# Patient Record
Sex: Male | Born: 1940 | Race: White | Hispanic: No | Marital: Single | Smoking: Former smoker
Health system: Southern US, Community
[De-identification: ages and names within clinical notes are randomized; demographics above are authoritative.]

## PROBLEM LIST (undated history)

## (undated) DIAGNOSIS — K219 Gastro-esophageal reflux disease without esophagitis: Secondary | ICD-10-CM

## (undated) DIAGNOSIS — F319 Bipolar disorder, unspecified: Secondary | ICD-10-CM

## (undated) DIAGNOSIS — F0280 Dementia in other diseases classified elsewhere without behavioral disturbance: Secondary | ICD-10-CM

## (undated) DIAGNOSIS — G309 Alzheimer's disease, unspecified: Secondary | ICD-10-CM

## (undated) DIAGNOSIS — R569 Unspecified convulsions: Secondary | ICD-10-CM

## (undated) DIAGNOSIS — I4891 Unspecified atrial fibrillation: Secondary | ICD-10-CM

## (undated) DIAGNOSIS — I509 Heart failure, unspecified: Secondary | ICD-10-CM

## (undated) DIAGNOSIS — I10 Essential (primary) hypertension: Secondary | ICD-10-CM

## (undated) DIAGNOSIS — F489 Nonpsychotic mental disorder, unspecified: Secondary | ICD-10-CM

## (undated) DIAGNOSIS — I639 Cerebral infarction, unspecified: Secondary | ICD-10-CM

---

## 2014-08-31 ENCOUNTER — Emergency Department (HOSPITAL_COMMUNITY): Payer: Medicare Other

## 2014-08-31 ENCOUNTER — Emergency Department (HOSPITAL_COMMUNITY)
Admission: EM | Admit: 2014-08-31 | Discharge: 2014-08-31 | Disposition: A | Discharge status: Home / Self Care | Payer: Medicare Other | Attending: Emergency Medicine | Admitting: Emergency Medicine

## 2014-08-31 ENCOUNTER — Encounter (HOSPITAL_COMMUNITY): Payer: Self-pay

## 2014-08-31 DIAGNOSIS — G40909 Epilepsy, unspecified, not intractable, without status epilepticus: Secondary | ICD-10-CM | POA: Insufficient documentation

## 2014-08-31 DIAGNOSIS — I509 Heart failure, unspecified: Secondary | ICD-10-CM | POA: Insufficient documentation

## 2014-08-31 DIAGNOSIS — Z79899 Other long term (current) drug therapy: Secondary | ICD-10-CM | POA: Diagnosis not present

## 2014-08-31 DIAGNOSIS — Z7982 Long term (current) use of aspirin: Secondary | ICD-10-CM | POA: Insufficient documentation

## 2014-08-31 DIAGNOSIS — Z8719 Personal history of other diseases of the digestive system: Secondary | ICD-10-CM | POA: Diagnosis not present

## 2014-08-31 DIAGNOSIS — Z7902 Long term (current) use of antithrombotics/antiplatelets: Secondary | ICD-10-CM | POA: Insufficient documentation

## 2014-08-31 DIAGNOSIS — Z8673 Personal history of transient ischemic attack (TIA), and cerebral infarction without residual deficits: Secondary | ICD-10-CM | POA: Insufficient documentation

## 2014-08-31 DIAGNOSIS — R451 Restlessness and agitation: Secondary | ICD-10-CM | POA: Diagnosis present

## 2014-08-31 DIAGNOSIS — I10 Essential (primary) hypertension: Secondary | ICD-10-CM | POA: Insufficient documentation

## 2014-08-31 HISTORY — DX: Nonpsychotic mental disorder, unspecified: F48.9

## 2014-08-31 HISTORY — DX: Unspecified atrial fibrillation: I48.91

## 2014-08-31 HISTORY — DX: Gastro-esophageal reflux disease without esophagitis: K21.9

## 2014-08-31 HISTORY — DX: Unspecified convulsions: R56.9

## 2014-08-31 HISTORY — DX: Essential (primary) hypertension: I10

## 2014-08-31 HISTORY — DX: Heart failure, unspecified: I50.9

## 2014-08-31 HISTORY — DX: Cerebral infarction, unspecified: I63.9

## 2014-08-31 HISTORY — DX: Bipolar disorder, unspecified: F31.9

## 2014-08-31 LAB — URINALYSIS, ROUTINE W REFLEX MICROSCOPIC
BILIRUBIN URINE: NEGATIVE
GLUCOSE, UA: NEGATIVE mg/dL
Hgb urine dipstick: NEGATIVE
Ketones, ur: NEGATIVE mg/dL
Leukocytes, UA: NEGATIVE
NITRITE: NEGATIVE
PH: 6.5 (ref 5.0–8.0)
Protein, ur: NEGATIVE mg/dL
SPECIFIC GRAVITY, URINE: 1.021 (ref 1.005–1.030)
Urobilinogen, UA: 1 mg/dL (ref 0.0–1.0)

## 2014-08-31 LAB — CBC WITH DIFFERENTIAL/PLATELET
Basophils Absolute: 0 10*3/uL (ref 0.0–0.1)
Basophils Relative: 0 % (ref 0–1)
EOS ABS: 0.1 10*3/uL (ref 0.0–0.7)
Eosinophils Relative: 2 % (ref 0–5)
HCT: 39.7 % (ref 39.0–52.0)
HEMOGLOBIN: 12.7 g/dL — AB (ref 13.0–17.0)
LYMPHS ABS: 1.1 10*3/uL (ref 0.7–4.0)
Lymphocytes Relative: 18 % (ref 12–46)
MCH: 29.3 pg (ref 26.0–34.0)
MCHC: 32 g/dL (ref 30.0–36.0)
MCV: 91.7 fL (ref 78.0–100.0)
MONO ABS: 0.6 10*3/uL (ref 0.1–1.0)
Monocytes Relative: 10 % (ref 3–12)
NEUTROS PCT: 70 % (ref 43–77)
Neutro Abs: 4.3 10*3/uL (ref 1.7–7.7)
Platelets: 207 10*3/uL (ref 150–400)
RBC: 4.33 MIL/uL (ref 4.22–5.81)
RDW: 13.4 % (ref 11.5–15.5)
WBC: 6 10*3/uL (ref 4.0–10.5)

## 2014-08-31 LAB — COMPREHENSIVE METABOLIC PANEL
ALT: 14 U/L (ref 0–53)
AST: 19 U/L (ref 0–37)
Albumin: 4.8 g/dL (ref 3.5–5.2)
Alkaline Phosphatase: 103 U/L (ref 39–117)
Anion gap: 9 (ref 5–15)
BUN: 24 mg/dL — ABNORMAL HIGH (ref 6–23)
CHLORIDE: 99 mmol/L (ref 96–112)
CO2: 26 mmol/L (ref 19–32)
Calcium: 9.5 mg/dL (ref 8.4–10.5)
Creatinine, Ser: 0.62 mg/dL (ref 0.50–1.35)
GFR calc Af Amer: >90 mL/min (ref >90)
GLUCOSE: 94 mg/dL (ref 70–99)
POTASSIUM: 3.9 mmol/L (ref 3.5–5.1)
SODIUM: 134 mmol/L — AB (ref 135–145)
TOTAL PROTEIN: 7.8 g/dL (ref 6.0–8.3)
Total Bilirubin: 0.3 mg/dL (ref 0.3–1.2)

## 2014-08-31 LAB — PHENYTOIN LEVEL, TOTAL

## 2014-08-31 LAB — VALPROIC ACID LEVEL: Valproic Acid Lvl: <10 ug/mL — ABNORMAL LOW (ref 50.0–100.0)

## 2014-08-31 NOTE — Progress Notes (Signed)
CSW met with pt at bedside upon his request. There was no family present. Per note, pt's chief complaint for presenting to [facility] is due to agitation. Pt was extremely difficult to understand. Pt speaks at a very face pace and appears to have word salad.   Pt states that he is hungry. CSW consulted who states that pt cannot have food at this time. CSW informed pt.  Per note, pt is from [facility] and staff states that there has been a change in behavior.  Willette Brace [phone] ED CSW 08/31/2014 5:42 PM

## 2014-08-31 NOTE — ED Provider Notes (Signed)
CSN: [id number]     Arrival date & time 08/31/14  1116 History   First MD Initiated Contact with Patient 08/31/14 1228     Chief Complaint  Patient presents with  . Agitation      HPI Pt was seen at 1240. Per EMS, [facility] report and [facility] states pt "got mad" and "kicked some furniture," so they called EMS.  EMS states pt was calm and cooperative on their arrival to scene and throughout his transport to the ED. Pt states "the staff was bothering me and I didn't want to be bothered." Pt states he "feels fine." Denies any complaints.    Past Medical History  Diagnosis Date  . Seizures   . Stroke   . CHF (congestive heart failure)   . Hypertension   . Atrial fibrillation   . GERD (gastroesophageal reflux disease)   . Bipolar 1 disorder   . Mental health problem     hoarder     History reviewed. No pertinent past surgical history.   Family History  Problem Relation Age of Onset  . Family history unknown: Yes   History  Substance Use Topics  . Smoking status: Former Games developermoker  . Smokeless tobacco: Never Used  . Alcohol Use: No    Review of Systems ROS: Statement: All systems negative except as marked or noted in the HPI; Constitutional: Negative for fever and chills. ; ; Eyes: Negative for eye pain, redness and discharge. ; ; ENMT: Negative for ear pain, hoarseness, nasal congestion, sinus pressure and sore throat. ; ; Cardiovascular: Negative for chest pain, palpitations, diaphoresis, dyspnea and peripheral edema. ; ; Respiratory: Negative for cough, wheezing and stridor. ; ; Gastrointestinal: Negative for nausea, vomiting, diarrhea, abdominal pain, blood in stool, hematemesis, jaundice and rectal bleeding. . ; ; Genitourinary: Negative for dysuria, flank pain and hematuria. ; ; Musculoskeletal: Negative for back pain and neck pain. Negative for swelling and trauma.; ; Skin: Negative for pruritus, rash, abrasions, blisters, bruising and skin lesion.; ; Neuro: Negative  for headache, lightheadedness and neck stiffness. Negative for weakness, altered level of consciousness , altered mental status, extremity weakness, paresthesias, involuntary movement, seizure and syncope.; Psych:  +agitated.   Allergies  Review of patient's allergies indicates not on file.  Home Medications   Prior to Admission medications   Medication Sig Start Date End Date Taking? Authorizing Provider  acetaminophen (TYLENOL) 500 MG tablet Take 500 mg by mouth every 4 (four) hours as needed for moderate pain.   Yes Historical Provider, MD  albuterol (PROVENTIL HFA;VENTOLIN HFA) 108 (90 BASE) MCG/ACT inhaler Inhale 2 puffs into the lungs every 6 (six) hours as needed for wheezing or shortness of breath.   Yes Historical Provider, MD  alum & mag hydroxide-simeth (MAALOX/MYLANTA) 200-200-20 MG/5ML suspension Take 30 mLs by mouth every 6 (six) hours as needed for indigestion or heartburn.   Yes Historical Provider, MD  aspirin 81 MG tablet Take 81 mg by mouth daily.   Yes Historical Provider, MD  atorvastatin (LIPITOR) 80 MG tablet Take 80 mg by mouth at bedtime.   Yes Historical Provider, MD  clopidogrel (PLAVIX) 75 MG tablet Take 75 mg by mouth daily.   Yes Historical Provider, MD  diltiazem (CARDIZEM) 30 MG tablet Take 30 mg by mouth 2 (two) times daily.   Yes Historical Provider, MD  divalproex (DEPAKOTE SPRINKLE) 125 MG capsule Take 125 mg by mouth 3 (three) times daily.   Yes Historical Provider, MD  enalapril (VASOTEC)  10 MG tablet Take 10 mg by mouth daily.   Yes Historical Provider, MD  ergocalciferol (VITAMIN D2) 50000 UNITS capsule Take 50,000 Units by mouth once a week. Fridays   Yes Historical Provider, MD  furosemide (LASIX) 20 MG tablet Take 20 mg by mouth.   Yes Historical Provider, MD  guaifenesin (ROBITUSSIN) 100 MG/5ML syrup Take 200 mg by mouth 3 (three) times daily as needed for cough.   Yes Historical Provider, MD  levETIRAcetam (KEPPRA) 500 MG tablet Take 1,000 mg by  mouth 2 (two) times daily.   Yes Historical Provider, MD  loperamide (IMODIUM) 2 MG capsule Take 2 mg by mouth as needed for diarrhea or loose stools.   Yes Historical Provider, MD  magnesium hydroxide (MILK OF MAGNESIA) 400 MG/5ML suspension Take 30 mLs by mouth daily as needed for mild constipation or moderate constipation.   Yes Historical Provider, MD  Melatonin 3 MG TABS Take 1 tablet by mouth at bedtime.   Yes Historical Provider, MD  metoprolol (LOPRESSOR) 50 MG tablet Take 50 mg by mouth daily.   Yes Historical Provider, MD  ondansetron (ZOFRAN) 4 MG tablet Take 4 mg by mouth every 6 (six) hours as needed for nausea or vomiting.   Yes Historical Provider, MD  phenytoin (DILANTIN) 100 MG ER capsule Take 200 mg by mouth 3 (three) times daily.   Yes Historical Provider, MD  ranitidine (ZANTAC) 150 MG tablet Take 150 mg by mouth 2 (two) times daily.   Yes Historical Provider, MD  risperiDONE (RISPERDAL) 1 MG/ML oral solution Take 1 mg by mouth 2 (two) times daily.   Yes Historical Provider, MD   BP 128/68 mmHg  Pulse 72  Temp(Src) 97.5 F (36.4 C) (Oral)  Resp 20  SpO2 94% Physical Exam  1245: Physical examination:  Nursing notes reviewed; Vital signs and O2 SAT reviewed;  Constitutional: Well developed, Well nourished, Well hydrated, In no acute distress; Head:  Normocephalic, atraumatic; Eyes: EOMI, PERRL, No scleral icterus; ENMT: Mouth and pharynx normal, Mucous membranes moist; Neck: Supple, Full range of motion, No lymphadenopathy; Cardiovascular: Regular rate and rhythm, No gallop; Respiratory: Breath sounds clear & equal bilaterally, No wheezes.  Speaking full sentences with ease, Normal respiratory effort/excursion; Chest: Nontender, Movement normal; Abdomen: Soft, Nontender, Nondistended, Normal bowel sounds; Genitourinary: No CVA tenderness; Extremities: Pulses normal, No tenderness, No edema, No calf edema or asymmetry.; Neuro: AA&Ox3, Major CN grossly intact.  Speech clear. Moves  all extremities spontaneously and to command without apparent gross focal deficits.; Skin: Color normal, Warm, Dry.; Psych:  Calm, cooperative.   ED Course  Procedures     EKG Interpretation None      MDM  MDM Reviewed: nursing note and vitals Interpretation: labs, x-ray and CT scan     Results for orders placed or performed during the hospital encounter of 08/31/14  Comprehensive metabolic panel  Result Value Ref Range   Sodium 134 (L) 135 - 145 mmol/L   Potassium 3.9 3.5 - 5.1 mmol/L   Chloride 99 96 - 112 mmol/L   CO2 26 19 - 32 mmol/L   Glucose, Bld 94 70 - 99 mg/dL   BUN 24 (H) 6 - 23 mg/dL   Creatinine, Ser 1.61 0.50 - 1.35 mg/dL   Calcium 9.5 8.4 - 09.6 mg/dL   Total Protein 7.8 6.0 - 8.3 g/dL   Albumin 4.8 3.5 - 5.2 g/dL   AST 19 0 - 37 U/L   ALT 14 0 - 53 U/L  Alkaline Phosphatase 103 39 - 117 U/L   Total Bilirubin 0.3 0.3 - 1.2 mg/dL   GFR calc non Af Amer >90 >90 mL/min   GFR calc Af Amer >90 >90 mL/min   Anion gap 9 5 - 15  CBC with Differential  Result Value Ref Range   WBC 6.0 4.0 - 10.5 K/uL   RBC 4.33 4.22 - 5.81 MIL/uL   Hemoglobin 12.7 (L) 13.0 - 17.0 g/dL   HCT 16.1 09.6 - 04.5 %   MCV 91.7 78.0 - 100.0 fL   MCH 29.3 26.0 - 34.0 pg   MCHC 32.0 30.0 - 36.0 g/dL   RDW 40.9 81.1 - 91.4 %   Platelets 207 150 - 400 K/uL   Neutrophils Relative % 70 43 - 77 %   Neutro Abs 4.3 1.7 - 7.7 K/uL   Lymphocytes Relative 18 12 - 46 %   Lymphs Abs 1.1 0.7 - 4.0 K/uL   Monocytes Relative 10 3 - 12 %   Monocytes Absolute 0.6 0.1 - 1.0 K/uL   Eosinophils Relative 2 0 - 5 %   Eosinophils Absolute 0.1 0.0 - 0.7 K/uL   Basophils Relative 0 0 - 1 %   Basophils Absolute 0.0 0.0 - 0.1 K/uL  Urinalysis, Routine w reflex microscopic  Result Value Ref Range   Color, Urine YELLOW YELLOW   APPearance CLEAR CLEAR   Specific Gravity, Urine 1.021 1.005 - 1.030   pH 6.5 5.0 - 8.0   Glucose, UA NEGATIVE NEGATIVE mg/dL   Hgb urine dipstick NEGATIVE NEGATIVE    Bilirubin Urine NEGATIVE NEGATIVE   Ketones, ur NEGATIVE NEGATIVE mg/dL   Protein, ur NEGATIVE NEGATIVE mg/dL   Urobilinogen, UA 1.0 0.0 - 1.0 mg/dL   Nitrite NEGATIVE NEGATIVE   Leukocytes, UA NEGATIVE NEGATIVE   Dg Chest 2 View 08/31/2014   CLINICAL DATA:  Altered mental status  EXAM: CHEST  2 VIEW  COMPARISON:  None.  FINDINGS: Heart size upper normal. Negative for heart failure. Negative for pneumonia or effusion. Lungs are clear.  IMPRESSION: No active cardiopulmonary disease.   Electronically Signed   By: Marlan Palau M.D.   On: 08/31/2014 13:51   Ct Head Wo Contrast 08/31/2014   CLINICAL DATA:  Altered mental status  EXAM: CT HEAD WITHOUT CONTRAST  TECHNIQUE: Contiguous axial images were obtained from the base of the skull through the vertex without intravenous contrast.  COMPARISON:  None.  FINDINGS: There is mild diffuse atrophy. There is no intracranial mass, hemorrhage, extra-axial fluid collection, or midline shift. There is a focal infarct in the superior posterior left temporal lobe which may be recent. There is a prior infarct involving portions of the head of the caudate nucleus on the left, the anterior aspect of the left internal capsule, and the medial most aspect of the left lentiform nucleus. There is also a prior infarct involving the head of the caudate nucleus on the right. There is mild periventricular small vessel disease in the centra semiovale bilaterally.  The bony calvarium appears intact. The mastoid air cells are clear. There is an air-fluid level in the right maxillary antrum with mucoperiosteal thickening in the right maxillary antrum. There is opacification of multiple ethmoid air cells bilaterally. There is leftward deviation of the nasal septum.  IMPRESSION: Age uncertain and possibly recent infarct in the posterior superior left temporal lobe. Prior small infarct in the left basal ganglia. There is mild atrophy with slight periventricular small vessel disease.  No  hemorrhage  or mass effect.  Areas of paranasal sinus disease. Note air-fluid level in the right maxillary antrum.   Electronically Signed   By: Bretta Bang III M.D.   On: 08/31/2014 13:55    1600:  CT with possible recent CVA; MRI brain pending. Depakote and dilantin levels also pending. Sign out to Dr. Lynelle Doctor.   Samuel Jester, DO 08/31/14 1606

## 2014-08-31 NOTE — ED Notes (Signed)
Pt is in X-Ray. 

## 2014-08-31 NOTE — ED Notes (Signed)
Per EMS [facility] [address] Patient was aggressive and combative today. Staff states this is a change in his behavior.

## 2014-08-31 NOTE — ED Notes (Signed)
EMS stated that the staff reported that the patient is normally a hoarder. Patient has styrofoam containers covering his body, inside his shirt.

## 2014-08-31 NOTE — ED Notes (Signed)
[facility]. History charted. Staff reported that the patient kicked furniture when he got mad this AM.

## 2014-08-31 NOTE — ED Notes (Signed)
Bed: WA14 Expected date:  Expected time:  Means of arrival:  Comments: EMS 

## 2014-08-31 NOTE — ED Notes (Signed)
Patient transported to MRI 

## 2014-08-31 NOTE — ED Notes (Signed)
[facility] called  

## 2014-08-31 NOTE — ED Provider Notes (Signed)
Pt seen by Dr Clarene DukeMcManus earlier.  MRI does not show an acute stroke.  Valproic acid and dilantin levels are low and not elevated.  No acute medical issues at this time.  Stable for discharge.  Linwood DibblesJon Darik Massing, MD 08/31/14 [phone]

## 2014-09-02 LAB — URINE CULTURE: Colony Count: 15000

## 2014-09-03 ENCOUNTER — Telehealth (HOSPITAL_BASED_OUTPATIENT_CLINIC_OR_DEPARTMENT_OTHER): Payer: Self-pay | Admitting: Emergency Medicine

## 2014-09-03 NOTE — Telephone Encounter (Signed)
Post ED Visit - Positive Culture Follow-up  Culture report reviewed by antimicrobial stewardship pharmacist: []  Wes Dulaney, Pharm.D., BCPS [x]  Celedonio MiyamotoJeremy Frens, Pharm.D., BCPS []  Georgina PillionElizabeth Martin, Pharm.D., BCPS []  [place]arksvilleMinh Pham, [address] BCPS, AAHIVP []  Estella HuskMichelle Turner, Pharm.D., BCPS, AAHIVP []  Elder CyphersLorie Poole, [address] BCPS  Positive urine  Culture group B strep Treated with none,asymptomatic, no further patient follow-up is required at this time.  Berle MullMiller, Baruc Tugwell 09/03/2014, 10:29 AM

## 2014-09-28 ENCOUNTER — Emergency Department (HOSPITAL_COMMUNITY): Payer: Medicare Other

## 2014-09-28 ENCOUNTER — Emergency Department (HOSPITAL_COMMUNITY)
Admission: EM | Admit: 2014-09-28 | Discharge: 2014-09-28 | Disposition: A | Discharge status: Home / Self Care | Payer: Medicare Other | Attending: Emergency Medicine | Admitting: Emergency Medicine

## 2014-09-28 ENCOUNTER — Encounter (HOSPITAL_COMMUNITY): Payer: Self-pay | Admitting: *Deleted

## 2014-09-28 DIAGNOSIS — Z79899 Other long term (current) drug therapy: Secondary | ICD-10-CM | POA: Insufficient documentation

## 2014-09-28 DIAGNOSIS — I509 Heart failure, unspecified: Secondary | ICD-10-CM | POA: Insufficient documentation

## 2014-09-28 DIAGNOSIS — I10 Essential (primary) hypertension: Secondary | ICD-10-CM | POA: Insufficient documentation

## 2014-09-28 DIAGNOSIS — S0101XA Laceration without foreign body of scalp, initial encounter: Secondary | ICD-10-CM | POA: Diagnosis not present

## 2014-09-28 DIAGNOSIS — Y92128 Other place in nursing home as the place of occurrence of the external cause: Secondary | ICD-10-CM | POA: Insufficient documentation

## 2014-09-28 DIAGNOSIS — W1839XA Other fall on same level, initial encounter: Secondary | ICD-10-CM | POA: Diagnosis not present

## 2014-09-28 DIAGNOSIS — Z7982 Long term (current) use of aspirin: Secondary | ICD-10-CM | POA: Insufficient documentation

## 2014-09-28 DIAGNOSIS — S0990XA Unspecified injury of head, initial encounter: Secondary | ICD-10-CM | POA: Diagnosis present

## 2014-09-28 DIAGNOSIS — K219 Gastro-esophageal reflux disease without esophagitis: Secondary | ICD-10-CM | POA: Diagnosis not present

## 2014-09-28 DIAGNOSIS — G40909 Epilepsy, unspecified, not intractable, without status epilepticus: Secondary | ICD-10-CM | POA: Insufficient documentation

## 2014-09-28 DIAGNOSIS — Y9389 Activity, other specified: Secondary | ICD-10-CM | POA: Insufficient documentation

## 2014-09-28 DIAGNOSIS — F319 Bipolar disorder, unspecified: Secondary | ICD-10-CM | POA: Insufficient documentation

## 2014-09-28 DIAGNOSIS — Y998 Other external cause status: Secondary | ICD-10-CM | POA: Diagnosis not present

## 2014-09-28 DIAGNOSIS — F0390 Unspecified dementia without behavioral disturbance: Secondary | ICD-10-CM | POA: Diagnosis not present

## 2014-09-28 DIAGNOSIS — W19XXXA Unspecified fall, initial encounter: Secondary | ICD-10-CM

## 2014-09-28 DIAGNOSIS — Z7902 Long term (current) use of antithrombotics/antiplatelets: Secondary | ICD-10-CM | POA: Insufficient documentation

## 2014-09-28 DIAGNOSIS — I4891 Unspecified atrial fibrillation: Secondary | ICD-10-CM | POA: Diagnosis not present

## 2014-09-28 DIAGNOSIS — Z87891 Personal history of nicotine dependence: Secondary | ICD-10-CM | POA: Diagnosis not present

## 2014-09-28 DIAGNOSIS — Z23 Encounter for immunization: Secondary | ICD-10-CM | POA: Diagnosis not present

## 2014-09-28 DIAGNOSIS — Z8673 Personal history of transient ischemic attack (TIA), and cerebral infarction without residual deficits: Secondary | ICD-10-CM | POA: Diagnosis not present

## 2014-09-28 LAB — COMPREHENSIVE METABOLIC PANEL
ALT: 15 U/L (ref 0–53)
AST: 21 U/L (ref 0–37)
Albumin: 4 g/dL (ref 3.5–5.2)
Alkaline Phosphatase: 83 U/L (ref 39–117)
Anion gap: 9 (ref 5–15)
BILIRUBIN TOTAL: 0.5 mg/dL (ref 0.3–1.2)
BUN: 17 mg/dL (ref 6–23)
CHLORIDE: 101 mmol/L (ref 96–112)
CO2: 24 mmol/L (ref 19–32)
Calcium: 9.4 mg/dL (ref 8.4–10.5)
Creatinine, Ser: 0.66 mg/dL (ref 0.50–1.35)
GFR calc Af Amer: >90 mL/min (ref >90)
GFR calc non Af Amer: >90 mL/min (ref >90)
GLUCOSE: 105 mg/dL — AB (ref 70–99)
Potassium: 4 mmol/L (ref 3.5–5.1)
SODIUM: 134 mmol/L — AB (ref 135–145)
Total Protein: 6.7 g/dL (ref 6.0–8.3)

## 2014-09-28 LAB — CBC WITH DIFFERENTIAL/PLATELET
Basophils Absolute: 0 10*3/uL (ref 0.0–0.1)
Basophils Relative: 0 % (ref 0–1)
EOS ABS: 0.2 10*3/uL (ref 0.0–0.7)
EOS PCT: 2 % (ref 0–5)
HCT: 40.2 % (ref 39.0–52.0)
Hemoglobin: 13.3 g/dL (ref 13.0–17.0)
LYMPHS ABS: 1 10*3/uL (ref 0.7–4.0)
Lymphocytes Relative: 15 % (ref 12–46)
MCH: 29.4 pg (ref 26.0–34.0)
MCHC: 33.1 g/dL (ref 30.0–36.0)
MCV: 88.7 fL (ref 78.0–100.0)
MONO ABS: 0.9 10*3/uL (ref 0.1–1.0)
Monocytes Relative: 13 % — ABNORMAL HIGH (ref 3–12)
NEUTROS PCT: 70 % (ref 43–77)
Neutro Abs: 4.9 10*3/uL (ref 1.7–7.7)
PLATELETS: 174 10*3/uL (ref 150–400)
RBC: 4.53 MIL/uL (ref 4.22–5.81)
RDW: 13.3 % (ref 11.5–15.5)
WBC: 7 10*3/uL (ref 4.0–10.5)

## 2014-09-28 LAB — VALPROIC ACID LEVEL

## 2014-09-28 LAB — TROPONIN I

## 2014-09-28 LAB — PHENYTOIN LEVEL, TOTAL

## 2014-09-28 MED ORDER — RISPERIDONE 0.5 MG PO TABS
1.0000 mg | ORAL_TABLET | Freq: Once | ORAL | Status: DC
  Filled 2014-09-28: qty 2

## 2014-09-28 MED ORDER — TETANUS-DIPHTH-ACELL PERTUSSIS 5-2.5-18.5 LF-MCG/0.5 IM SUSP
0.5000 mL | Freq: Once | INTRAMUSCULAR | Status: AC
  Administered 2014-09-28: 0.5 mL via INTRAMUSCULAR
  Filled 2014-09-28: qty 0.5

## 2014-09-28 MED ORDER — LORAZEPAM 2 MG/ML IJ SOLN
1.0000 mg | Freq: Once | INTRAMUSCULAR | Status: AC
  Administered 2014-09-28: 1 mg via INTRAMUSCULAR

## 2014-09-28 MED ORDER — ACETAMINOPHEN 325 MG PO TABS
650.0000 mg | ORAL_TABLET | Freq: Once | ORAL | Status: DC
  Filled 2014-09-28: qty 2

## 2014-09-28 MED ORDER — LORAZEPAM 2 MG/ML IJ SOLN
1.0000 mg | Freq: Once | INTRAMUSCULAR | Status: DC
  Filled 2014-09-28: qty 1

## 2014-09-28 NOTE — ED Notes (Signed)
Pt tolerating oral fluids 

## 2014-09-28 NOTE — ED Notes (Signed)
Per dr. Manus Gunningancour, pt okay to have coke.

## 2014-09-28 NOTE — ED Notes (Signed)
[facility] Pt had an unwitnessed fall 12-24 hours ago and has a head lac around 1 inch in length. Pt is on plavix and aspirin.

## 2014-09-28 NOTE — ED Notes (Signed)
[facility] CALLED  °

## 2014-09-28 NOTE — ED Notes (Signed)
Phlebotomy notified patient ready for blood work.

## 2014-09-28 NOTE — Discharge Instructions (Signed)
Fall Prevention and Home Safety Follow up in 1 week for suture removal. Return to the ED if you develop new or worsening symptoms. Falls cause injuries and can affect all age groups. It is possible to use preventive measures to significantly decrease the likelihood of falls. There are many simple measures which can make your home safer and prevent falls. OUTDOORS  Repair cracks and edges of walkways and driveways.  Remove high doorway thresholds.  Trim shrubbery on the main path into your home.  Have good outside lighting.  Clear walkways of tools, rocks, debris, and clutter.  Check that handrails are not broken and are securely fastened. Both sides of steps should have handrails.  Have leaves, snow, and ice cleared regularly.  Use sand or salt on walkways during winter months.  In the garage, clean up grease or oil spills. BATHROOM  Install night lights.  Install grab bars by the toilet and in the tub and shower.  Use non-skid mats or decals in the tub or shower.  Place a plastic non-slip stool in the shower to sit on, if needed.  Keep floors dry and clean up all water on the floor immediately.  Remove soap buildup in the tub or shower on a regular basis.  Secure bath mats with non-slip, double-sided rug tape.  Remove throw rugs and tripping hazards from the floors. BEDROOMS  Install night lights.  Make sure a bedside light is easy to reach.  Do not use oversized bedding.  Keep a telephone by your bedside.  Have a firm chair with side arms to use for getting dressed.  Remove throw rugs and tripping hazards from the floor. KITCHEN  Keep handles on pots and pans turned toward the center of the stove. Use back burners when possible.  Clean up spills quickly and allow time for drying.  Avoid walking on wet floors.  Avoid hot utensils and knives.  Position shelves so they are not too high or low.  Place commonly used objects within easy reach.  If  necessary, use a sturdy step stool with a grab bar when reaching.  Keep electrical cables out of the way.  Do not use floor polish or wax that makes floors slippery. If you must use wax, use non-skid floor wax.  Remove throw rugs and tripping hazards from the floor. STAIRWAYS  Never leave objects on stairs.  Place handrails on both sides of stairways and use them. Fix any loose handrails. Make sure handrails on both sides of the stairways are as long as the stairs.  Check carpeting to make sure it is firmly attached along stairs. Make repairs to worn or loose carpet promptly.  Avoid placing throw rugs at the top or bottom of stairways, or properly secure the rug with carpet tape to prevent slippage. Get rid of throw rugs, if possible.  Have an electrician put in a light switch at the top and bottom of the stairs. OTHER FALL PREVENTION TIPS  Wear low-heel or rubber-soled shoes that are supportive and fit well. Wear closed toe shoes.  When using a stepladder, make sure it is fully opened and both spreaders are firmly locked. Do not climb a closed stepladder.  Add color or contrast paint or tape to grab bars and handrails in your home. Place contrasting color strips on first and last steps.  Learn and use mobility aids as needed. Install an electrical emergency response system.  Turn on lights to avoid dark areas. Replace light bulbs that burn out  immediately. Get light switches that glow.  Arrange furniture to create clear pathways. Keep furniture in the same place.  Firmly attach carpet with non-skid or double-sided tape.  Eliminate uneven floor surfaces.  Select a carpet pattern that does not visually hide the edge of steps.  Be aware of all pets. OTHER HOME SAFETY TIPS  Set the water temperature for 120 F (48.8 C).  Keep emergency numbers on or near the telephone.  Keep smoke detectors on every level of the home and near sleeping areas. Document Released: 06/12/2002  Document Revised: 12/22/2011 Document Reviewed: 09/11/2011 [facility] Patient Information 2015 [place], [place]. This information is not intended to replace advice given to you by your health care provider. Make sure you discuss any questions you have with your health care provider.

## 2014-09-28 NOTE — ED Notes (Signed)
Pt ambulated through hallway with 1 staff assist with steady gait.

## 2014-09-28 NOTE — ED Notes (Signed)
Pt refused discharge vitals 

## 2014-09-28 NOTE — ED Provider Notes (Signed)
CSN: [id number]     Arrival date & time 09/28/14  1046 History   First MD Initiated Contact with Patient 09/28/14 1056     Chief Complaint  Patient presents with  . Head Laceration  . Fall     (Consider location/radiation/quality/duration/timing/severity/associated sxs/prior Treatment) HPI Comments: Level V caveat for dementia and aggressive mental status. Patient by EMS after apparent fall at nursing home. Fall was unwitnessed. Patient found to have laceration to top of head this morning that happened sometime yesterday. On arrival he is belligerent and not following commands. He is aggressive and combative and required chemical restraint for patient and staff safety.  Patient is a 74 y.o. male presenting with fall. The history is provided by the patient and the EMS personnel. The history is limited by the condition of the patient.  Fall    Past Medical History  Diagnosis Date  . Seizures   . Stroke   . CHF (congestive heart failure)   . Hypertension   . Atrial fibrillation   . GERD (gastroesophageal reflux disease)   . Bipolar 1 disorder   . Mental health problem     hoarder   History reviewed. No pertinent past surgical history. Family History  Problem Relation Age of Onset  . Family history unknown: Yes   History  Substance Use Topics  . Smoking status: Former Games developer  . Smokeless tobacco: Never Used  . Alcohol Use: No    Review of Systems  Unable to perform ROS: Mental status change      Allergies  Review of patient's allergies indicates not on file.  Home Medications   Prior to Admission medications   Medication Sig Start Date End Date Taking? Authorizing Provider  acetaminophen (TYLENOL) 500 MG tablet Take 500 mg by mouth every 4 (four) hours as needed for mild pain, fever or headache.    Yes Historical Provider, MD  albuterol (PROVENTIL HFA;VENTOLIN HFA) 108 (90 BASE) MCG/ACT inhaler Inhale 2 puffs into the lungs every 6 (six) hours as needed for  wheezing or shortness of breath.   Yes Historical Provider, MD  Alum & Mag Hydroxide-Simeth (GERI-LANTA PO) Take 30 mLs by mouth every 6 (six) hours as needed (heartburn/indigestion).   Yes Historical Provider, MD  aspirin EC 81 MG tablet Take 81 mg by mouth daily.   Yes Historical Provider, MD  atorvastatin (LIPITOR) 80 MG tablet Take 80 mg by mouth at bedtime.   Yes Historical Provider, MD  clopidogrel (PLAVIX) 75 MG tablet Take 75 mg by mouth daily.   Yes Historical Provider, MD  diltiazem (CARDIZEM) 30 MG tablet Take 30 mg by mouth 2 (two) times daily.   Yes Historical Provider, MD  divalproex (DEPAKOTE SPRINKLE) 125 MG capsule Take 125 mg by mouth 3 (three) times daily.   Yes Historical Provider, MD  enalapril (VASOTEC) 10 MG tablet Take 10 mg by mouth daily.   Yes Historical Provider, MD  ergocalciferol (VITAMIN D2) 50000 UNITS capsule Take 50,000 Units by mouth 2 (two) times a week. For 6 weeks. On Tuesday and Friday. Start 09/14/14.   Yes Historical Provider, MD  furosemide (LASIX) 20 MG tablet Take 20 mg by mouth daily.    Yes Historical Provider, MD  levETIRAcetam (KEPPRA) 500 MG tablet Take 1,000 mg by mouth 2 (two) times daily.   Yes Historical Provider, MD  Melatonin 3 MG TABS Take 1 tablet by mouth at bedtime.   Yes Historical Provider, MD  metoprolol (LOPRESSOR) 50 MG tablet Take 50 mg  by mouth daily.   Yes Historical Provider, MD  ondansetron (ZOFRAN) 4 MG tablet Take 4 mg by mouth every 6 (six) hours as needed for nausea or vomiting.   Yes Historical Provider, MD  phenytoin (DILANTIN) 100 MG ER capsule Take 200 mg by mouth 3 (three) times daily.   Yes Historical Provider, MD  ranitidine (ZANTAC) 150 MG tablet Take 150 mg by mouth 2 (two) times daily.   Yes Historical Provider, MD  risperiDONE (RISPERDAL) 1 MG/ML oral solution Take 1 mg by mouth 2 (two) times daily.   Yes Historical Provider, MD  guaifenesin (ROBITUSSIN) 100 MG/5ML syrup Take 200 mg by mouth every 6 (six) hours as  needed for cough.     Historical Provider, MD  loperamide (IMODIUM) 2 MG capsule Take 2 mg by mouth as needed for diarrhea or loose stools.    Historical Provider, MD  magnesium hydroxide (MILK OF MAGNESIA) 400 MG/5ML suspension Take 30 mLs by mouth daily as needed for mild constipation or moderate constipation.    Historical Provider, MD   BP 130/58 mmHg  Pulse 75  Temp(Src) 98.4 F (36.9 C) (Oral)  Resp 16  SpO2 99% Physical Exam  Constitutional: He appears well-developed and well-nourished. No distress.  Screaming profanities, aggressive, oriented 1. Moving all extremities  HENT:  Head: Normocephalic and atraumatic.  Mouth/Throat: Oropharynx is clear and moist. No oropharyngeal exudate.  4 cm laceration to frontal scalp.  Eyes: Conjunctivae and EOM are normal. Pupils are equal, round, and reactive to light.  Neck: Normal range of motion. Neck supple.  No C spine tenderness  Cardiovascular: Normal rate and regular rhythm.   No murmur heard. Pulmonary/Chest: Effort normal and breath sounds normal. No respiratory distress.  Abdominal: Soft. There is no tenderness. There is no rebound and no guarding.  Musculoskeletal: Normal range of motion. He exhibits no edema or tenderness.  FROM hips without pain.  Neurological: No cranial nerve deficit.  Moving all extremities  Skin: Skin is warm.    ED Course  Procedures (including critical care time) Labs Review Labs Reviewed  CBC WITH DIFFERENTIAL/PLATELET - Abnormal; Notable for the following:    Monocytes Relative 13 (*)    All other components within normal limits  COMPREHENSIVE METABOLIC PANEL - Abnormal; Notable for the following:    Sodium 134 (*)    Glucose, Bld 105 (*)    All other components within normal limits  PHENYTOIN LEVEL, TOTAL - Abnormal; Notable for the following:    Phenytoin Lvl <2.5 (*)    All other components within normal limits  VALPROIC ACID LEVEL - Abnormal; Notable for the following:    Valproic Acid  Lvl <10.0 (*)    All other components within normal limits  TROPONIN I  URINALYSIS, ROUTINE W REFLEX MICROSCOPIC  URINE RAPID DRUG SCREEN (HOSP PERFORMED)    Imaging Review Ct Head Wo Contrast  09/28/2014   CLINICAL DATA:  Pain following fall last night  EXAM: CT HEAD WITHOUT CONTRAST  CT CERVICAL SPINE WITHOUT CONTRAST  TECHNIQUE: Multidetector CT imaging of the head and cervical spine was performed following the standard protocol without intravenous contrast. Multiplanar CT image reconstructions of the cervical spine were also generated.  COMPARISON:  Head CT and brain MRI August 31, 2014  FINDINGS: CT HEAD FINDINGS  Moderate diffuse atrophy is stable. There is no intracranial mass, hemorrhage, extra-axial fluid collection, or midline shift. There is evidence of a prior infarct involving the medial left lentiform nucleus, stable. There is also  a small lacunar infarct in the lateral right lentiform nucleus, stable. There is mild patchy small vessel disease in the centra semiovale bilaterally, stable. No new gray-white compartment lesion is seen. No acute infarct is apparent. The bony calvarium appears intact. The mastoid air cells are clear. There is debris in the left external auditory canal.  CT CERVICAL SPINE FINDINGS  There is no demonstrable fracture or spondylolisthesis. Prevertebral soft tissues and predental space regions are normal. There is severe disc space narrowing at all levels except for C2-3. There is vacuum phenomenon at C3-4, C7-T1, and T1-2. There is facet hypertrophy to varying degrees at all levels bilaterally. There is calcification in the posterior longitudinal ligament at C4-5, C5-6, and C6-7. There is mild spinal stenosis at C4-5, C5-6, C6-7 due to bony hypertrophy and disc bulging. There is no frank disc extrusion. There is calcification in the nuchal ligament in the midcervical region posteriorly. There is extensive calcification in both carotid arteries.  IMPRESSION: CT head:  Atrophy with small vessel disease and prior basal ganglia region lacunar type infarcts, stable. No intracranial mass, hemorrhage, or extra-axial fluid collection. No acute infarct apparent.  There is probable cerumen in the left external auditory canal.  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 No acute fracture or spondylolisthesis. Extensive calcification in both carotid arteries.   Electronically Signed   By: Bretta Bang III M.D.   On: 09/28/2014 12:28   Ct Cervical Spine Wo Contrast  09/28/2014   CLINICAL DATA:  Pain following fall last night  EXAM: CT HEAD WITHOUT CONTRAST  CT CERVICAL SPINE WITHOUT CONTRAST  TECHNIQUE: Multidetector CT imaging of the head and cervical spine was performed following the standard protocol without intravenous contrast. Multiplanar CT image reconstructions of the cervical spine were also generated.  COMPARISON:  Head CT and brain MRI August 31, 2014  FINDINGS: CT HEAD FINDINGS  Moderate diffuse atrophy is stable. There is no intracranial mass, hemorrhage, extra-axial fluid collection, or midline shift. There is evidence of a prior infarct involving the medial left lentiform nucleus, stable. There is also a small lacunar infarct in the lateral right lentiform nucleus, stable. There is mild patchy small vessel disease in the centra semiovale bilaterally, stable. No new gray-white compartment lesion is seen. No acute infarct is apparent. The bony calvarium appears intact. The mastoid air cells are clear. There is debris in the left external auditory canal.  CT CERVICAL SPINE FINDINGS  There is no demonstrable fracture or spondylolisthesis. Prevertebral soft tissues and predental space regions are normal. There is severe disc space narrowing at all levels except for C2-3. There is vacuum phenomenon at C3-4, C7-T1, and T1-2. There is facet hypertrophy to varying degrees at all levels bilaterally. There is calcification in the posterior  longitudinal ligament at C4-5, C5-6, and C6-7. There is mild spinal stenosis at C4-5, C5-6, C6-7 due to bony hypertrophy and disc bulging. There is no frank disc extrusion. There is calcification in the nuchal ligament in the midcervical region posteriorly. There is extensive calcification in both carotid arteries.  IMPRESSION: CT head: Atrophy with small vessel disease and prior basal ganglia region lacunar type infarcts, stable. No intracranial mass, hemorrhage, or extra-axial fluid collection. No acute infarct apparent.  There is probable cerumen in the left external auditory canal.  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 No acute fracture or spondylolisthesis. Extensive calcification in both carotid arteries.   Electronically Signed   By: Bretta Bang III M.D.  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 Laceration to scalp. He is combative on arrival. Chemical sedation given for safety of patient and staff Scalp laceration without other apparent injury.  Imaging negative. Laceration repaired by PA Hedges.  Patient tolerating fluids and is ambulatory. He appears to be at his baseline. Dilantin and Depakote are undetectable. This was discussed with Dr. Roseanne RenoStewart. As patient has had no recent seizure activity he does not recommend loading doses at this time.  Patient calmer, cooperating. Denies any pain. Tolerating PO and ambulatory. ECF confirms his mental  status is baseline. He appears stable for return. Staple removal in 1 week.  Glynn OctaveStephen Cybill Uriegas, MD 09/28/14 [phone]

## 2014-09-28 NOTE — ED Notes (Signed)
Pt is rambling incoherently, moving around in bed. Pt given ativan. Labs and CT will be delayed until patient has relaxed.

## 2014-09-28 NOTE — ED Notes (Signed)
Patient was given a [place] sandwich bag with a coke.

## 2014-09-28 NOTE — ED Notes (Signed)
Pt returned from CT °

## 2014-09-28 NOTE — ED Notes (Signed)
Nurse tech at bedside, talking softly with patient.

## 2014-10-08 ENCOUNTER — Emergency Department (HOSPITAL_COMMUNITY)
Admission: EM | Admit: 2014-10-08 | Discharge: 2014-10-08 | Disposition: A | Discharge status: Home / Self Care | Payer: Medicare Other | Attending: Emergency Medicine | Admitting: Emergency Medicine

## 2014-10-08 ENCOUNTER — Encounter (HOSPITAL_COMMUNITY): Payer: Self-pay | Admitting: Emergency Medicine

## 2014-10-08 ENCOUNTER — Emergency Department (HOSPITAL_COMMUNITY): Payer: Medicare Other

## 2014-10-08 DIAGNOSIS — F0391 Unspecified dementia with behavioral disturbance: Secondary | ICD-10-CM | POA: Diagnosis not present

## 2014-10-08 DIAGNOSIS — G40909 Epilepsy, unspecified, not intractable, without status epilepticus: Secondary | ICD-10-CM | POA: Insufficient documentation

## 2014-10-08 DIAGNOSIS — I509 Heart failure, unspecified: Secondary | ICD-10-CM | POA: Insufficient documentation

## 2014-10-08 DIAGNOSIS — Z87891 Personal history of nicotine dependence: Secondary | ICD-10-CM | POA: Insufficient documentation

## 2014-10-08 DIAGNOSIS — I4891 Unspecified atrial fibrillation: Secondary | ICD-10-CM | POA: Diagnosis not present

## 2014-10-08 DIAGNOSIS — Z8673 Personal history of transient ischemic attack (TIA), and cerebral infarction without residual deficits: Secondary | ICD-10-CM | POA: Insufficient documentation

## 2014-10-08 DIAGNOSIS — Z7902 Long term (current) use of antithrombotics/antiplatelets: Secondary | ICD-10-CM | POA: Diagnosis not present

## 2014-10-08 DIAGNOSIS — F319 Bipolar disorder, unspecified: Secondary | ICD-10-CM | POA: Insufficient documentation

## 2014-10-08 DIAGNOSIS — F911 Conduct disorder, childhood-onset type: Secondary | ICD-10-CM | POA: Diagnosis present

## 2014-10-08 DIAGNOSIS — K219 Gastro-esophageal reflux disease without esophagitis: Secondary | ICD-10-CM | POA: Diagnosis not present

## 2014-10-08 DIAGNOSIS — I10 Essential (primary) hypertension: Secondary | ICD-10-CM | POA: Insufficient documentation

## 2014-10-08 DIAGNOSIS — Z7982 Long term (current) use of aspirin: Secondary | ICD-10-CM | POA: Insufficient documentation

## 2014-10-08 DIAGNOSIS — Z79899 Other long term (current) drug therapy: Secondary | ICD-10-CM | POA: Insufficient documentation

## 2014-10-08 DIAGNOSIS — R410 Disorientation, unspecified: Secondary | ICD-10-CM

## 2014-10-08 LAB — CBC WITH DIFFERENTIAL/PLATELET
Basophils Absolute: 0 K/uL (ref 0.0–0.1)
Basophils Relative: 0 % (ref 0–1)
Eosinophils Absolute: 0.1 K/uL (ref 0.0–0.7)
Eosinophils Relative: 1 % (ref 0–5)
HCT: 40.3 % (ref 39.0–52.0)
Hemoglobin: 13.4 g/dL (ref 13.0–17.0)
Lymphocytes Relative: 9 % — ABNORMAL LOW (ref 12–46)
Lymphs Abs: 0.9 K/uL (ref 0.7–4.0)
MCH: 29.8 pg (ref 26.0–34.0)
MCHC: 33.3 g/dL (ref 30.0–36.0)
MCV: 89.6 fL (ref 78.0–100.0)
Monocytes Absolute: 0.9 K/uL (ref 0.1–1.0)
Monocytes Relative: 9 % (ref 3–12)
Neutro Abs: 7.9 K/uL — ABNORMAL HIGH (ref 1.7–7.7)
Neutrophils Relative %: 81 % — ABNORMAL HIGH (ref 43–77)
Platelets: 164 K/uL (ref 150–400)
RBC: 4.5 MIL/uL (ref 4.22–5.81)
RDW: 13.1 % (ref 11.5–15.5)
WBC: 9.7 K/uL (ref 4.0–10.5)

## 2014-10-08 LAB — URINALYSIS, ROUTINE W REFLEX MICROSCOPIC
Bilirubin Urine: NEGATIVE
Glucose, UA: NEGATIVE mg/dL
Hgb urine dipstick: NEGATIVE
Ketones, ur: NEGATIVE mg/dL
Leukocytes, UA: NEGATIVE
Nitrite: NEGATIVE
Protein, ur: NEGATIVE mg/dL
Specific Gravity, Urine: 1.023 (ref 1.005–1.030)
Urobilinogen, UA: 0.2 mg/dL (ref 0.0–1.0)
pH: 6 (ref 5.0–8.0)

## 2014-10-08 LAB — COMPREHENSIVE METABOLIC PANEL WITH GFR
ALT: 15 U/L (ref 0–53)
AST: 18 U/L (ref 0–37)
Albumin: 4.6 g/dL (ref 3.5–5.2)
Alkaline Phosphatase: 86 U/L (ref 39–117)
Anion gap: 13 (ref 5–15)
BUN: 22 mg/dL (ref 6–23)
CO2: 23 mmol/L (ref 19–32)
Calcium: 9.3 mg/dL (ref 8.4–10.5)
Chloride: 104 mmol/L (ref 96–112)
Creatinine, Ser: 0.8 mg/dL (ref 0.50–1.35)
GFR calc Af Amer: >90 mL/min
GFR calc non Af Amer: 87 mL/min — ABNORMAL LOW
Glucose, Bld: 98 mg/dL (ref 70–99)
Potassium: 3.9 mmol/L (ref 3.5–5.1)
Sodium: 140 mmol/L (ref 135–145)
Total Bilirubin: 0.8 mg/dL (ref 0.3–1.2)
Total Protein: 7.1 g/dL (ref 6.0–8.3)

## 2014-10-08 MED ORDER — LORAZEPAM 2 MG/ML IJ SOLN: 1.0000 mg | INTRAMUSCULAR | Status: DC | PRN

## 2014-10-08 MED ORDER — LORAZEPAM 0.5 MG PO TABS: 0.5000 mg | ORAL_TABLET | Freq: Three times a day (TID) | ORAL | Status: AC | PRN

## 2014-10-08 MED ORDER — LORAZEPAM 2 MG/ML IJ SOLN
2.0000 mg | Freq: Once | INTRAMUSCULAR | Status: AC
  Administered 2014-10-08: 2 mg via INTRAMUSCULAR
  Filled 2014-10-08: qty 1

## 2014-10-08 NOTE — ED Notes (Signed)
Pt to aggressive to complete orders at this time. Cussing and verbally threatening towards staff.

## 2014-10-08 NOTE — ED Notes (Signed)
Pt will not allow staff to obtain vital signs

## 2014-10-08 NOTE — ED Provider Notes (Signed)
CSN: [id number]     Arrival date & time 10/08/14  1614 History   First MD Initiated Contact with Patient 10/08/14 1623     Chief Complaint  Patient presents with  . Aggressive Behavior      HPI  Temperature evaluation from his care facility. Apparently was agitated and profane at the care facility. Apparently has become more verbally aggressive. Over the last few months. History of dementia. Is on Resporal for behavioral disturbance on a regular basis. Seen and evaluated here recently with a fall and had a normal head CT.  No description of fever hypoxemia injury medication changes or other concerns on part of care facility.  Past Medical History  Diagnosis Date  . Seizures   . Stroke   . CHF (congestive heart failure)   . Hypertension   . Atrial fibrillation   . GERD (gastroesophageal reflux disease)   . Bipolar 1 disorder   . Mental health problem     hoarder   No past surgical history on file. Family History  Problem Relation Age of Onset  . Family history unknown: Yes   History  Substance Use Topics  . Smoking status: Former Games developermoker  . Smokeless tobacco: Never Used  . Alcohol Use: No    Review of Systems  Unable to perform ROS: Dementia      Allergies  Review of patient's allergies indicates no known allergies.  Home Medications   Prior to Admission medications   Medication Sig Start Date End Date Taking? Authorizing Provider  acetaminophen (TYLENOL) 500 MG tablet Take 500 mg by mouth every 4 (four) hours as needed for mild pain, fever or headache.    Yes Historical Provider, MD  albuterol (PROVENTIL HFA;VENTOLIN HFA) 108 (90 BASE) MCG/ACT inhaler Inhale 2 puffs into the lungs every 6 (six) hours as needed for wheezing or shortness of breath.   Yes Historical Provider, MD  Alum & Mag Hydroxide-Simeth (GERI-LANTA PO) Take 30 mLs by mouth every 6 (six) hours as needed (heartburn/indigestion).   Yes Historical Provider, MD  aspirin EC 81 MG tablet Take 81 mg by  mouth daily.   Yes Historical Provider, MD  atorvastatin (LIPITOR) 80 MG tablet Take 80 mg by mouth at bedtime.   Yes Historical Provider, MD  clopidogrel (PLAVIX) 75 MG tablet Take 75 mg by mouth daily.   Yes Historical Provider, MD  diltiazem (CARDIZEM) 30 MG tablet Take 30 mg by mouth 2 (two) times daily.   Yes Historical Provider, MD  divalproex (DEPAKOTE SPRINKLE) 125 MG capsule Take 250 mg by mouth 3 (three) times daily.    Yes Historical Provider, MD  enalapril (VASOTEC) 10 MG tablet Take 10 mg by mouth daily.   Yes Historical Provider, MD  ergocalciferol (VITAMIN D2) 50000 UNITS capsule Take 50,000 Units by mouth 2 (two) times a week. For 6 weeks. On Tuesday and Friday. Start 09/14/14.   Yes Historical Provider, MD  furosemide (LASIX) 20 MG tablet Take 20 mg by mouth daily.    Yes Historical Provider, MD  guaifenesin (ROBITUSSIN) 100 MG/5ML syrup Take 200 mg by mouth every 6 (six) hours as needed for cough.    Yes Historical Provider, MD  levETIRAcetam (KEPPRA) 500 MG tablet Take 1,000 mg by mouth 2 (two) times daily.   Yes Historical Provider, MD  loperamide (IMODIUM) 2 MG capsule Take 2 mg by mouth as needed for diarrhea or loose stools.   Yes Historical Provider, MD  magnesium hydroxide (MILK OF MAGNESIA) 400 MG/5ML suspension  Take 30 mLs by mouth daily as needed for mild constipation or moderate constipation.   Yes Historical Provider, MD  Melatonin 3 MG TABS Take 1 tablet by mouth at bedtime.   Yes Historical Provider, MD  metoprolol (LOPRESSOR) 50 MG tablet Take 50 mg by mouth daily.   Yes Historical Provider, MD  ondansetron (ZOFRAN) 4 MG tablet Take 4 mg by mouth every 6 (six) hours as needed for nausea or vomiting.   Yes Historical Provider, MD  phenytoin (DILANTIN) 100 MG ER capsule Take 200 mg by mouth 3 (three) times daily.   Yes Historical Provider, MD  PRESCRIPTION MEDICATION Apply 1 mL topically every 6 (six) hours as needed (for agitation).   Yes Historical Provider, MD    ranitidine (ZANTAC) 150 MG tablet Take 150 mg by mouth 2 (two) times daily.   Yes Historical Provider, MD  risperiDONE (RISPERDAL) 1 MG/ML oral solution Take 1 mg by mouth 2 (two) times daily.   Yes Historical Provider, MD  LORazepam (ATIVAN) 0.5 MG tablet Take 1 tablet (0.5 mg total) by mouth 3 (three) times daily as needed (agitation). 10/08/14   Rolland Porter, MD   BP 139/77 mmHg  Pulse 78  Resp 16  SpO2 100% Physical Exam  Constitutional: He appears well-developed and well-nourished. No distress.  HENT:  Head: Normocephalic.    Eyes: Conjunctivae are normal. Pupils are equal, round, and reactive to light. No scleral icterus.  Neck: Normal range of motion. Neck supple. No thyromegaly present.  Cardiovascular: Normal rate and regular rhythm.  Exam reveals no gallop and no friction rub.   No murmur heard. Pulmonary/Chest: Effort normal and breath sounds normal. No respiratory distress. He has no wheezes. He has no rales.  Abdominal: Soft. Bowel sounds are normal. He exhibits no distension. There is no tenderness. There is no rebound.  Musculoskeletal: Normal range of motion.  Neurological: He is alert.  Skin: Skin is warm and dry. No rash noted.  Psychiatric: He has a normal mood and affect. His behavior is normal.  Easily agitated and profane. He answers simple questioning.    ED Course  Procedures (including critical care time) Labs Review Labs Reviewed  CBC WITH DIFFERENTIAL/PLATELET - Abnormal; Notable for the following:    Neutrophils Relative % 81 (*)    Neutro Abs 7.9 (*)    Lymphocytes Relative 9 (*)    All other components within normal limits  COMPREHENSIVE METABOLIC PANEL - Abnormal; Notable for the following:    GFR calc non Af Amer 87 (*)    All other components within normal limits  URINE CULTURE  URINALYSIS, ROUTINE W REFLEX MICROSCOPIC    Imaging Review Ct Head Wo Contrast  10/08/2014   CLINICAL DATA:  Confusion, altered mental status, history seizures,  stroke, CHF, hypertension, atrial fibrillation, former smoker  EXAM: CT HEAD WITHOUT CONTRAST  TECHNIQUE: Contiguous axial images were obtained from the base of the skull through the vertex without intravenous contrast.  COMPARISON:  09/28/2014  FINDINGS: Generalized atrophy.  Normal ventricular morphology.  No midline shift or mass effect.  Old BILATERAL basal ganglia lacunar infarcts.  Minimal small vessel chronic ischemic changes of deep cerebral white matter.  No intracranial hemorrhage, mass lesion, or evidence acute infarction.  No extra-axial fluid collections.  Extensive atherosclerotic calcifications at the carotid siphons.  Bones and sinuses unremarkable.  IMPRESSION: Atrophy with minimal small vessel chronic ischemic changes of deep cerebral white matter.  Old basal ganglia lacunar infarcts.  No acute intracranial abnormalities.  Electronically Signed   By: Ulyses Southward M.D.   On: 10/08/2014 17:53     EKG Interpretation None      MDM   Final diagnoses:  Confusion  Dementia, with behavioral disturbance    Patient's medical screening exam does not show acute abdomen eyes. He is not hypoxemic or febrile. Given Ativan here has been common in sleeping. I had our social worker speak with his care facility. I of written a prescription for when necessary Ativan. I've asked that his primary care physician continue ongoing evaluation and treatment for his progressive behavioral disturbances secondary to his dementia.    Rolland Porter, MD 10/08/14 2026

## 2014-10-08 NOTE — ED Notes (Signed)
Pt resting in a supine position with eyes closed. Respirations are even, regular, and unlabored.

## 2014-10-08 NOTE — Progress Notes (Signed)
CSW called [facility], and spoke [place] [employer]-Med Tech. CSW informed med tech that the pt will be discharged tonight and that the EDP states he will be given a prescription of PRN ativan to help with agitation.  Med tech states that she was already aware that the pt will be transported back to the facility tonight.  Trish MageBrittney Nitza Schmid, LCSWA [phone] ED CSW 10/08/2014 8:34 PM

## 2014-10-08 NOTE — ED Notes (Signed)
Notified [facility] for transportation back to [facility].  

## 2014-10-08 NOTE — ED Notes (Signed)
[facility] EMS[facility] normally pleasant, talks a lot, but pleasant.  Pt refused to take meds today and started throwing chairs.  Upon EMS arrival, pt continued to be aggressive and 5mg  of haldol was given IM.  After haldol, pt became pleasant and was just talking constantly.

## 2014-10-08 NOTE — ED Notes (Signed)
Witnessed in and out cath done by [employer] Alex Garner

## 2014-10-08 NOTE — ED Notes (Signed)
Bed: [facility] Expected date:  Expected time:  Means of arrival:  Comments: 

## 2014-10-08 NOTE — ED Notes (Signed)
Bed: [id number] Expected date:  Expected time:  Means of arrival:  Comments: "aggressive" 74 y/o M (not aggressive just talks a lot)

## 2014-10-08 NOTE — Discharge Instructions (Signed)
Screening exam today reveals no acute abnormalities. New prescription Ativan when necessary for agitation. Continue evaluation and treatment with primary care physician for further episodes.   Dementia Dementia is a general term for problems with brain function. A person with dementia has memory loss and a hard time with at least one other brain function such as thinking, speaking, or problem solving. Dementia can affect social functioning, how you do your job, your mood, or your personality. The changes may be hidden for a long time. The earliest forms of this disease are usually not detected by family or friends. Dementia can be:  Irreversible.  Potentially reversible.  Partially reversible.  Progressive. This means it can get worse over time. CAUSES  Irreversible dementia causes may include:  Degeneration of brain cells (Alzheimer disease or Lewy body dementia).  Multiple small strokes (vascular dementia).  Infection (chronic meningitis or Creutzfeldt-Jakob disease).  Frontotemporal dementia. This affects younger people, age 30 to 52, compared to those who have Alzheimer disease.  Dementia associated with other disorders like Parkinson disease, Huntington disease, or HIV-associated dementia. Potentially or partially reversible dementia causes may include:  Medicines.  Metabolic causes such as excessive alcohol intake, vitamin B12 deficiency, or thyroid disease.  Masses or pressure in the brain such as a tumor, blood clot, or hydrocephalus. SIGNS AND SYMPTOMS  Symptoms are often hard to detect. Family members or coworkers may not notice them early in the disease process. Different people with dementia may have different symptoms. Symptoms can include:  A hard time with memory, especially recent memory. Long-term memory may not be impaired.  Asking the same question multiple times or forgetting something someone just said.  A hard time speaking your thoughts or finding  certain words.  A hard time solving problems or performing familiar tasks (such as how to use a telephone).  Sudden changes in mood.  Changes in personality, especially increasing moodiness or mistrust.  Depression.  A hard time understanding complex ideas that were never a problem in the past. DIAGNOSIS  There are no specific tests for dementia.   Your health care provider may recommend a thorough evaluation. This is because some forms of dementia can be reversible. The evaluation will likely include a physical exam and getting a detailed history from you and a family member. The history often gives the best clues and suggestions for a diagnosis.  Memory testing may be done. A detailed brain function evaluation called neuropsychologic testing may be helpful.  Lab tests and brain imaging (such as a CT scan or MRI scan) are sometimes important.  Sometimes observation and re-evaluation over time is very helpful. TREATMENT  Treatment depends on the cause.   If the problem is a vitamin deficiency, it may be helped or cured with supplements.  For dementias such as Alzheimer disease, medicines are available to stabilize or slow the course of the disease. There are no cures for this type of dementia.  Your health care provider can help direct you to groups, organizations, and other health care providers to help with decisions in the care of you or your loved one. HOME CARE INSTRUCTIONS The care of individuals with dementia is varied and dependent upon the progression of the dementia. The following suggestions are intended for the person living with, or caring for, the person with dementia.  Create a safe environment.  Remove the locks on bathroom doors to prevent the person from accidentally locking himself or herself in.  Use childproof latches on kitchen cabinets and  any place where cleaning supplies, chemicals, or alcohol are kept.  Use childproof covers in unused electrical  outlets.  Install childproof devices to keep doors and windows secured.  Remove stove knobs or install safety knobs and an automatic shut-off on the stove.  Lower the temperature on water heaters.  Label medicines and keep them locked up.  Secure knives, lighters, matches, power tools, and guns, and keep these items out of reach.  Keep the house free from clutter. Remove rugs or anything that might contribute to a fall.  Remove objects that might break and hurt the person.  Make sure lighting is good, both inside and outside.  Install grab rails as needed.  Use a monitoring device to alert you to falls or other needs for help.  Reduce confusion.  Keep familiar objects and people around.  Use night lights or dim lights at night.  Label items or areas.  Use reminders, notes, or directions for daily activities or tasks.  Keep a simple, consistent routine for waking, meals, bathing, dressing, and bedtime.  Create a calm, quiet environment.  Place large clocks and calendars prominently.  Display emergency numbers and home address near all telephones.  Use cues to establish different times of the day. An example is to open curtains to let the natural light in during the day.   Use effective communication.  Choose simple words and short sentences.  Use a gentle, calm tone of voice.  Be careful not to interrupt.  If the person is struggling to find a word or communicate a thought, try to provide the word or thought.  Ask one question at a time. Allow the person ample time to answer questions. Repeat the question again if the person does not respond.  Reduce nighttime restlessness.  Provide a comfortable bed.  Have a consistent nighttime routine.  Ensure a regular walking or physical activity schedule. Involve the person in daily activities as much as possible.  Limit napping during the day.  Limit caffeine.  Attend social events that stimulate rather than  overwhelm the senses.  Encourage good nutrition and hydration.  Reduce distractions during meal times and snacks.  Avoid foods that are too hot or too cold.  Monitor chewing and swallowing ability.  Continue with routine vision, hearing, dental, and medical screenings.  Give medicines only as directed by the health care provider.  Monitor driving abilities. Do not allow the person to drive when safe driving is no longer possible.  Register with an identification program which could provide location assistance in the event of a missing person situation. SEEK MEDICAL CARE IF:   New behavioral problems start such as moodiness, aggressiveness, or seeing things that are not there (hallucinations).  Any new problem with brain function happens. This includes problems with balance, speech, or falling a lot.  Problems with swallowing develop.  Any symptoms of other illness happen. Small changes or worsening in any aspect of brain function can be a sign that the illness is getting worse. It can also be a sign of another medical illness such as infection. Seeing a health care provider right away is important. SEEK IMMEDIATE MEDICAL CARE IF:   A fever develops.  New or worsened confusion develops.  New or worsened sleepiness develops.  Staying awake becomes hard to do. Document Released: 12/16/2000 Document Revised: 11/06/2013 Document Reviewed: 11/17/2010 [facility] Patient Information 2015 [place] [place]. This information is not intended to replace advice given to you by your health care provider. Make sure  you discuss any questions you have with your health care provider.

## 2014-10-09 LAB — URINE CULTURE
CULTURE: NO GROWTH
Colony Count: NO GROWTH

## 2014-10-30 ENCOUNTER — Emergency Department (HOSPITAL_COMMUNITY)
Admission: EM | Admit: 2014-10-30 | Discharge: 2014-10-31 | Disposition: A | Discharge status: Home / Self Care | Payer: Medicare Other | Attending: Emergency Medicine | Admitting: Emergency Medicine

## 2014-10-30 ENCOUNTER — Emergency Department (HOSPITAL_COMMUNITY)
Admission: EM | Admit: 2014-10-30 | Discharge: 2014-10-30 | Disposition: A | Discharge status: Home / Self Care | Payer: Medicare Other | Attending: Emergency Medicine | Admitting: Emergency Medicine

## 2014-10-30 ENCOUNTER — Encounter (HOSPITAL_COMMUNITY): Payer: Self-pay | Admitting: Emergency Medicine

## 2014-10-30 ENCOUNTER — Emergency Department (HOSPITAL_COMMUNITY): Payer: Medicare Other

## 2014-10-30 ENCOUNTER — Encounter (HOSPITAL_COMMUNITY): Payer: Self-pay

## 2014-10-30 DIAGNOSIS — R451 Restlessness and agitation: Secondary | ICD-10-CM | POA: Insufficient documentation

## 2014-10-30 DIAGNOSIS — Z7902 Long term (current) use of antithrombotics/antiplatelets: Secondary | ICD-10-CM | POA: Insufficient documentation

## 2014-10-30 DIAGNOSIS — Z8669 Personal history of other diseases of the nervous system and sense organs: Secondary | ICD-10-CM | POA: Diagnosis not present

## 2014-10-30 DIAGNOSIS — I10 Essential (primary) hypertension: Secondary | ICD-10-CM | POA: Diagnosis not present

## 2014-10-30 DIAGNOSIS — Z79899 Other long term (current) drug therapy: Secondary | ICD-10-CM | POA: Diagnosis not present

## 2014-10-30 DIAGNOSIS — Y998 Other external cause status: Secondary | ICD-10-CM | POA: Insufficient documentation

## 2014-10-30 DIAGNOSIS — Z87828 Personal history of other (healed) physical injury and trauma: Secondary | ICD-10-CM | POA: Diagnosis not present

## 2014-10-30 DIAGNOSIS — W01198A Fall on same level from slipping, tripping and stumbling with subsequent striking against other object, initial encounter: Secondary | ICD-10-CM | POA: Insufficient documentation

## 2014-10-30 DIAGNOSIS — W07XXXA Fall from chair, initial encounter: Secondary | ICD-10-CM | POA: Insufficient documentation

## 2014-10-30 DIAGNOSIS — Z7982 Long term (current) use of aspirin: Secondary | ICD-10-CM | POA: Insufficient documentation

## 2014-10-30 DIAGNOSIS — F319 Bipolar disorder, unspecified: Secondary | ICD-10-CM | POA: Diagnosis not present

## 2014-10-30 DIAGNOSIS — Z87891 Personal history of nicotine dependence: Secondary | ICD-10-CM | POA: Insufficient documentation

## 2014-10-30 DIAGNOSIS — Z8673 Personal history of transient ischemic attack (TIA), and cerebral infarction without residual deficits: Secondary | ICD-10-CM | POA: Diagnosis not present

## 2014-10-30 DIAGNOSIS — G40909 Epilepsy, unspecified, not intractable, without status epilepticus: Secondary | ICD-10-CM | POA: Insufficient documentation

## 2014-10-30 DIAGNOSIS — T1490XA Injury, unspecified, initial encounter: Secondary | ICD-10-CM

## 2014-10-30 DIAGNOSIS — K219 Gastro-esophageal reflux disease without esophagitis: Secondary | ICD-10-CM | POA: Insufficient documentation

## 2014-10-30 DIAGNOSIS — Y9289 Other specified places as the place of occurrence of the external cause: Secondary | ICD-10-CM | POA: Diagnosis not present

## 2014-10-30 DIAGNOSIS — Z046 Encounter for general psychiatric examination, requested by authority: Secondary | ICD-10-CM | POA: Diagnosis present

## 2014-10-30 DIAGNOSIS — Y9389 Activity, other specified: Secondary | ICD-10-CM | POA: Insufficient documentation

## 2014-10-30 DIAGNOSIS — I509 Heart failure, unspecified: Secondary | ICD-10-CM | POA: Diagnosis not present

## 2014-10-30 DIAGNOSIS — F0390 Unspecified dementia without behavioral disturbance: Secondary | ICD-10-CM | POA: Insufficient documentation

## 2014-10-30 DIAGNOSIS — I4891 Unspecified atrial fibrillation: Secondary | ICD-10-CM | POA: Diagnosis not present

## 2014-10-30 DIAGNOSIS — S0990XA Unspecified injury of head, initial encounter: Secondary | ICD-10-CM | POA: Insufficient documentation

## 2014-10-30 DIAGNOSIS — W19XXXA Unspecified fall, initial encounter: Secondary | ICD-10-CM

## 2014-10-30 LAB — CBG MONITORING, ED: GLUCOSE-CAPILLARY: 127 mg/dL — AB (ref 70–99)

## 2014-10-30 MED ORDER — LORAZEPAM 1 MG PO TABS
2.0000 mg | ORAL_TABLET | Freq: Once | ORAL | Status: AC
  Administered 2014-10-30: 2 mg via ORAL
  Filled 2014-10-30: qty 2

## 2014-10-30 NOTE — Discharge Instructions (Signed)
Abrasion °An abrasion is a cut or scrape of the skin. Abrasions do not extend through all layers of the skin and most heal within 10 days. It is important to care for your abrasion properly to prevent infection. °CAUSES  °Most abrasions are caused by falling on, or gliding across, the ground or other surface. When your skin rubs on something, the outer and inner layer of skin rubs off, causing an abrasion. °DIAGNOSIS  °Your caregiver will be able to diagnose an abrasion during a physical exam.  °TREATMENT  °Your treatment depends on how large and deep the abrasion is. Generally, your abrasion will be cleaned with water and a mild soap to remove any dirt or debris. An antibiotic ointment may be put over the abrasion to prevent an infection. A bandage (dressing) may be wrapped around the abrasion to keep it from getting dirty.  °You may need a tetanus shot if: °· You cannot remember when you had your last tetanus shot. °· You have never had a tetanus shot. °· The injury broke your skin. °If you get a tetanus shot, your arm may swell, get red, and feel warm to the touch. This is common and not a problem. If you need a tetanus shot and you choose not to have one, there is a rare chance of getting tetanus. Sickness from tetanus can be serious.  °HOME CARE INSTRUCTIONS  °· If a dressing was applied, change it at least once a day or as directed by your caregiver. If the bandage sticks, soak it off with warm water.   °· Wash the area with water and a mild soap to remove all the ointment 2 times a day. Rinse off the soap and pat the area dry with a clean towel.   °· Reapply any ointment as directed by your caregiver. This will help prevent infection and keep the bandage from sticking. Use gauze over the wound and under the dressing to help keep the bandage from sticking.   °· Change your dressing right away if it becomes wet or dirty.   °· Only take over-the-counter or prescription medicines for pain, discomfort, or fever as  directed by your caregiver.   °· Follow up with your caregiver within 24-48 hours for a wound check, or as directed. If you were not given a wound-check appointment, look closely at your abrasion for redness, swelling, or pus. These are signs of infection. °SEEK IMMEDIATE MEDICAL CARE IF:  °· You have increasing pain in the wound.   °· You have redness, swelling, or tenderness around the wound.   °· You have pus coming from the wound.   °· You have a fever or persistent symptoms for more than 2-3 days. °· You have a fever and your symptoms suddenly get worse. °· You have a bad smell coming from the wound or dressing.   °MAKE SURE YOU:  °· Understand these instructions. °· Will watch your condition. °· Will get help right away if you are not doing well or get worse. °Document Released: 04/01/2005 Document Revised: 06/08/2012 Document Reviewed: 05/26/2011 °ExitCare® Patient Information ©2015 [employer]. This information is not intended to replace advice given to you by your health care provider. Make sure you discuss any questions you have with your health care provider. ° °Fall Prevention and Home Safety °Falls cause injuries and can affect all age groups. It is possible to use preventive measures to significantly decrease the likelihood of falls. There are many simple measures which can make your home safer and prevent falls. °OUTDOORS °· Repair   cracks and edges of walkways and driveways. °· Remove high doorway thresholds. °· Trim shrubbery on the main path into your home. °· Have good outside lighting. °· Clear walkways of tools, rocks, debris, and clutter. °· Check that handrails are not broken and are securely fastened. Both sides of steps should have handrails. °· Have leaves, snow, and ice cleared regularly. °· Use sand or salt on walkways during winter months. °· In the garage, clean up grease or oil spills. °BATHROOM °· Install night lights. °· Install grab bars by the toilet and in the tub and  shower. °· Use non-skid mats or decals in the tub or shower. °· Place a plastic non-slip stool in the shower to sit on, if needed. °· Keep floors dry and clean up all water on the floor immediately. °· Remove soap buildup in the tub or shower on a regular basis. °· Secure bath mats with non-slip, double-sided rug tape. °· Remove throw rugs and tripping hazards from the floors. °BEDROOMS °· Install night lights. °· Make sure a bedside light is easy to reach. °· Do not use oversized bedding. °· Keep a telephone by your bedside. °· Have a firm chair with side arms to use for getting dressed. °· Remove throw rugs and tripping hazards from the floor. °KITCHEN °· Keep handles on pots and pans turned toward the center of the stove. Use back burners when possible. °· Clean up spills quickly and allow time for drying. °· Avoid walking on wet floors. °· Avoid hot utensils and knives. °· Position shelves so they are not too high or low. °· Place commonly used objects within easy reach. °· If necessary, use a sturdy step stool with a grab bar when reaching. °· Keep electrical cables out of the way. °· Do not use floor polish or wax that makes floors slippery. If you must use wax, use non-skid floor wax. °· Remove throw rugs and tripping hazards from the floor. °STAIRWAYS °· Never leave objects on stairs. °· Place handrails on both sides of stairways and use them. Fix any loose handrails. Make sure handrails on both sides of the stairways are as long as the stairs. °· Check carpeting to make sure it is firmly attached along stairs. Make repairs to worn or loose carpet promptly. °· Avoid placing throw rugs at the top or bottom of stairways, or properly secure the rug with carpet tape to prevent slippage. Get rid of throw rugs, if possible. °· Have an electrician put in a light switch at the top and bottom of the stairs. °OTHER FALL PREVENTION TIPS °· Wear low-heel or rubber-soled shoes that are supportive and fit well. Wear  closed toe shoes. °· When using a stepladder, make sure it is fully opened and both spreaders are firmly locked. Do not climb a closed stepladder. °· Add color or contrast paint or tape to grab bars and handrails in your home. Place contrasting color strips on first and last steps. °· Learn and use mobility aids as needed. Install an electrical emergency response system. °· Turn on lights to avoid dark areas. Replace light bulbs that burn out immediately. Get light switches that glow. °· Arrange furniture to create clear pathways. Keep furniture in the same place. °· Firmly attach carpet with non-skid or double-sided tape. °· Eliminate uneven floor surfaces. °· Select a carpet pattern that does not visually hide the edge of steps. °· Be aware of all pets. °OTHER HOME SAFETY TIPS °· Set the water temperature for   120° F (48.8° C). °· Keep emergency numbers on or near the telephone. °· Keep smoke detectors on every level of the home and near sleeping areas. °Document Released: 06/12/2002 Document Revised: 12/22/2011 Document Reviewed: 09/11/2011 °ExitCare® Patient Information ©2015 [employer]. This information is not intended to replace advice given to you by your health care provider. Make sure you discuss any questions you have with your health care provider. ° °

## 2014-10-30 NOTE — ED Provider Notes (Addendum)
CSN: [id number]     Arrival date & time 10/30/14  4098 History   First MD Initiated Contact with Patient 10/30/14 [phone]     Chief Complaint  Patient presents with  . Fall  . Head Injury     (Consider location/radiation/quality/duration/timing/severity/associated sxs/prior Treatment) HPI Comments: Patient here from nursing home after witnessed fall where he struck his head. No reported loss of consciousness. Patient has a baseline history of dementia and is normally combative according to the old records. EMS called and patient presents at this time. No further history obtainable due to his current state  Patient is a 74 y.o. male presenting with fall and head injury. The history is provided by the patient. The history is limited by the condition of the patient.  Fall  Head Injury   Past Medical History  Diagnosis Date  . Seizures   . Stroke   . CHF (congestive heart failure)   . Hypertension   . Atrial fibrillation   . GERD (gastroesophageal reflux disease)   . Bipolar 1 disorder   . Mental health problem     hoarder   History reviewed. No pertinent past surgical history. Family History  Problem Relation Age of Onset  . Family history unknown: Yes   History  Substance Use Topics  . Smoking status: Former Games developer  . Smokeless tobacco: Never Used  . Alcohol Use: No    Review of Systems  Unable to perform ROS     Allergies  Review of patient's allergies indicates no known allergies.  Home Medications   Prior to Admission medications   Medication Sig Start Date End Date Taking? Authorizing Provider  acetaminophen (TYLENOL) 500 MG tablet Take 500 mg by mouth every 4 (four) hours as needed for mild pain, fever or headache.     Historical Provider, MD  albuterol (PROVENTIL HFA;VENTOLIN HFA) 108 (90 BASE) MCG/ACT inhaler Inhale 2 puffs into the lungs every 6 (six) hours as needed for wheezing or shortness of breath.    Historical Provider, MD  Alum & Mag  Hydroxide-Simeth (GERI-LANTA PO) Take 30 mLs by mouth every 6 (six) hours as needed (heartburn/indigestion).    Historical Provider, MD  aspirin EC 81 MG tablet Take 81 mg by mouth daily.    Historical Provider, MD  atorvastatin (LIPITOR) 80 MG tablet Take 80 mg by mouth at bedtime.    Historical Provider, MD  clopidogrel (PLAVIX) 75 MG tablet Take 75 mg by mouth daily.    Historical Provider, MD  diltiazem (CARDIZEM) 30 MG tablet Take 30 mg by mouth 2 (two) times daily.    Historical Provider, MD  divalproex (DEPAKOTE SPRINKLE) 125 MG capsule Take 250 mg by mouth 3 (three) times daily.     Historical Provider, MD  enalapril (VASOTEC) 10 MG tablet Take 10 mg by mouth daily.    Historical Provider, MD  ergocalciferol (VITAMIN D2) 50000 UNITS capsule Take 50,000 Units by mouth 2 (two) times a week. For 6 weeks. On Tuesday and Friday. Start 09/14/14.    Historical Provider, MD  furosemide (LASIX) 20 MG tablet Take 20 mg by mouth daily.     Historical Provider, MD  guaifenesin (ROBITUSSIN) 100 MG/5ML syrup Take 200 mg by mouth every 6 (six) hours as needed for cough.     Historical Provider, MD  levETIRAcetam (KEPPRA) 500 MG tablet Take 1,000 mg by mouth 2 (two) times daily.    Historical Provider, MD  loperamide (IMODIUM) 2 MG capsule Take 2 mg by mouth  as needed for diarrhea or loose stools.    Historical Provider, MD  LORazepam (ATIVAN) 0.5 MG tablet Take 1 tablet (0.5 mg total) by mouth 3 (three) times daily as needed (agitation). 10/08/14   Rolland PorterMark James, MD  magnesium hydroxide (MILK OF MAGNESIA) 400 MG/5ML suspension Take 30 mLs by mouth daily as needed for mild constipation or moderate constipation.    Historical Provider, MD  Melatonin 3 MG TABS Take 1 tablet by mouth at bedtime.    Historical Provider, MD  metoprolol (LOPRESSOR) 50 MG tablet Take 50 mg by mouth daily.    Historical Provider, MD  ondansetron (ZOFRAN) 4 MG tablet Take 4 mg by mouth every 6 (six) hours as needed for nausea or vomiting.     Historical Provider, MD  phenytoin (DILANTIN) 100 MG ER capsule Take 200 mg by mouth 3 (three) times daily.    Historical Provider, MD  PRESCRIPTION MEDICATION Apply 1 mL topically every 6 (six) hours as needed (for agitation).    Historical Provider, MD  ranitidine (ZANTAC) 150 MG tablet Take 150 mg by mouth 2 (two) times daily.    Historical Provider, MD  risperiDONE (RISPERDAL) 1 MG/ML oral solution Take 1 mg by mouth 2 (two) times daily.    Historical Provider, MD   BP 159/71 mmHg  Pulse 90  Resp 17  SpO2 100% Physical Exam  Constitutional: He is oriented to person, place, and time. He appears well-developed and well-nourished.  Non-toxic appearance. No distress.  HENT:  Head: Normocephalic and atraumatic.    Eyes: Conjunctivae, EOM and lids are normal. Pupils are equal, round, and reactive to light.  Neck: Normal range of motion. Neck supple. No tracheal deviation present. No thyroid mass present.  Cardiovascular: Normal rate, regular rhythm and normal heart sounds.  Exam reveals no gallop.   No murmur heard. Pulmonary/Chest: Effort normal and breath sounds normal. No stridor. No respiratory distress. He has no decreased breath sounds. He has no wheezes. He has no rhonchi. He has no rales.  Abdominal: Soft. Normal appearance and bowel sounds are normal. He exhibits no distension. There is no tenderness. There is no rebound and no CVA tenderness.  Musculoskeletal: Normal range of motion. He exhibits no edema or tenderness.  Neurological: He is alert and oriented to person, place, and time. He has normal strength. No cranial nerve deficit or sensory deficit. GCS eye subscore is 4. GCS verbal subscore is 5. GCS motor subscore is 6.  Skin: Skin is warm and dry. No abrasion and no rash noted.  Psychiatric: His mood appears anxious. His speech is rapid and/or pressured. He is agitated.  Nursing note and vitals reviewed.   ED Course  Procedures (including critical care time) Labs  Review Labs Reviewed - No data to display  Imaging Review No results found.   EKG Interpretation None      MDM   Final diagnoses:  Trauma  Trauma    Pt given ativan po due to his baseline agitation, xrays neg for acute injury,  2:37 PM Patient became sleepy after receiving Ativan and was monitored here until he was stable for discharge   Lorre NickAnthony Veta Dambrosia, MD 10/30/14 1137  Lorre NickAnthony Sabria Florido, MD 10/30/14 1437

## 2014-10-30 NOTE — ED Notes (Signed)
Dr. Freida BusmanAllen in to see pt prior to transport.  Pt too lethargic to transport to facility.  Dr. Freida BusmanAllen verbalized hold pt until more alert.  [facility] notified.

## 2014-10-30 NOTE — ED Notes (Signed)
Notified PT[facility] for transport.  Pt has eaten.  Report called.  Pt calmer with ativan on board for transport.

## 2014-10-30 NOTE — ED Notes (Signed)
[facility]  Pt was sitting in chair and fell forward striking head on another chair prior to sliding to floor.  Pt aggressive and angry on EMS arrival.  Nursing facility bribed pt to come to ED by giving him $5 and a 20 oz coke.  Pt refused giving up coke in transport.  Removed on ED arrival.  Unable to get full vital signs in route.  Vitals:  146/80, hr 90, resp 16. Unable to obtain cbg and pulse ox.  No blood thinners.  Pt c/o abrasion to forehead.

## 2014-10-30 NOTE — ED Provider Notes (Signed)
Patient presented to the ER with IVC. Patient is currently residing in a locked unit for Alzheimer's disease. He was here in the emergency department earlier today for evaluation and missed lunch and dinner. He reports that when he got back to the unit, they told him there was no food for him and he needed to eat out of the vending machine. He became angry and threatened staff. He reports that he simply was hungry and he has no intention of harming anyone.  Face to face Exam: HEENT - PERRLA Lungs - CTAB Heart - RRR, no M/R/G Abd - S/NT/ND Neuro - alert, oriented x3  Plan: Patient brought to the emergency department with involuntary commitment paperwork to have been initiated prior to arrival. Patient is alert, pleasant and engaging. He reports that he became angry when he was told he would not eat today and that's why he exhibits that he did. He does not have any intention of harming anyone. I do not believe that he be checked prior to her for involuntary commitment at this time. IVC proceedings were terminated, patient to be returned to the Alzheimer's unit.  Gilda Creasehristopher J Pollina, MD 10/30/14 2245

## 2014-10-30 NOTE — ED Notes (Signed)
Pt was here earlier today and was discharged back to the locked assisted care center  Pt was brought back this evening by [employer] under IVC because pt has not been taking his medication, has been having conversations with himself and has become threatening others and throwing things approaching others in a threatening manner and threatens to kill them  Pt has periods of agitation and anxiety and has become a danger to himself and others per IVC paperwork

## 2014-10-30 NOTE — ED Notes (Signed)
Bed: [id number] Expected date:  Expected time:  Means of arrival:  Comments: EMS- eldery, fall, dementia

## 2014-10-30 NOTE — ED Notes (Signed)
Spoke w/ Rosette RevealMonique McKinney[facility] discharge instructions reviewed w/ caregiver - denies any other questions at this time.

## 2014-10-30 NOTE — ED Notes (Signed)
Pt alert.  Ok to d/c.  PTAR here for departure.

## 2014-10-30 NOTE — ED Provider Notes (Signed)
CSN: [id number]     Arrival date & time 10/30/14  2025 History   First MD Initiated Contact with Patient 10/30/14 2059     Chief Complaint  Patient presents with  . IVC   . Homicidal     (Consider location/radiation/quality/duration/timing/severity/associated sxs/prior Treatment) HPI    PCP: Ron ParkerBOWEN,SAMUEL, MD Blood pressure 137/59, pulse 72, temperature 97.3 F (36.3 C), temperature source Oral, resp. rate 20, SpO2 96 %.  Tad MooreWilliam Kuhlmann is a 74 y.o.male with a significant PMH of seizures, stroke, hypertension, CHF, GERD, atrial fibrillation, Bipolar 1 disorder, Mental health problem (hoarder) presents to the ER with complaints of IVC from locked unit nursing home facility. The papers report that he was having anger outbursts, threatening to kill others and wouldn't take his medications.  The patient was seen in the ER earlier today after a fall due to taking too much medications. He was in the ER and given Ativan for agitation, his stay was prolonged due to sedation. He reports missing lunch and dinner and being very hungry. He was told that he wasn't able to have dinner but he could get a snack out of the vending machine with money. This upset him greatly and he became angry. The patient is coherent, calm and cooperative at this time. He is eating his second sandwich and drinking a Coke, he says he feels much better now. :    Past Medical History  Diagnosis Date  . Seizures   . Stroke   . CHF (congestive heart failure)   . Hypertension   . Atrial fibrillation   . GERD (gastroesophageal reflux disease)   . Bipolar 1 disorder   . Mental health problem     hoarder   History reviewed. No pertinent past surgical history. Family History  Problem Relation Age of Onset  . Family history unknown: Yes   History  Substance Use Topics  . Smoking status: Former Games developermoker  . Smokeless tobacco: Never Used  . Alcohol Use: No    Review of Systems  10 Systems reviewed and are negative  for acute change except as noted in the HPI.   Allergies  Review of patient's allergies indicates no known allergies.  Home Medications   Prior to Admission medications   Medication Sig Start Date End Date Taking? Authorizing Provider  aspirin EC 81 MG tablet Take 81 mg by mouth daily.   Yes Historical Provider, MD  atorvastatin (LIPITOR) 80 MG tablet Take 80 mg by mouth at bedtime.   Yes Historical Provider, MD  clopidogrel (PLAVIX) 75 MG tablet Take 75 mg by mouth daily.   Yes Historical Provider, MD  diltiazem (CARDIZEM) 30 MG tablet Take 30 mg by mouth 2 (two) times daily.   Yes Historical Provider, MD  enalapril (VASOTEC) 10 MG tablet Take 10 mg by mouth daily.   Yes Historical Provider, MD  furosemide (LASIX) 20 MG tablet Take 20 mg by mouth daily.    Yes Historical Provider, MD  levETIRAcetam (KEPPRA) 500 MG tablet Take 1,000 mg by mouth 2 (two) times daily.   Yes Historical Provider, MD  LORazepam (ATIVAN) 0.5 MG tablet Take 1 tablet (0.5 mg total) by mouth 3 (three) times daily as needed (agitation). 10/08/14  Yes Rolland PorterMark James, MD  Melatonin 3 MG TABS Take 1 tablet by mouth at bedtime.   Yes Historical Provider, MD  metoprolol (LOPRESSOR) 50 MG tablet Take 50 mg by mouth daily.   Yes Historical Provider, MD  phenytoin (DILANTIN) 100 MG ER capsule  Take 200 mg by mouth 3 (three) times daily.   Yes Historical Provider, MD  PRESCRIPTION MEDICATION Apply 1 mL topically every 6 (six) hours as needed (for agitation). Lorazepam /ml prefill syr   Yes Historical Provider, MD  ranitidine (ZANTAC) 150 MG tablet Take 150 mg by mouth 2 (two) times daily.   Yes Historical Provider, MD  risperiDONE (RISPERDAL) 1 MG/ML oral solution Take 1 mg by mouth 2 (two) times daily.   Yes Historical Provider, MD  acetaminophen (TYLENOL) 500 MG tablet Take 500 mg by mouth every 4 (four) hours as needed for mild pain, fever or headache.     Historical Provider, MD  albuterol (PROVENTIL HFA;VENTOLIN HFA) 108 (90  BASE) MCG/ACT inhaler Inhale 2 puffs into the lungs every 6 (six) hours as needed for wheezing or shortness of breath.    Historical Provider, MD  Alum & Mag Hydroxide-Simeth (GERI-LANTA PO) Take 30 mLs by mouth every 6 (six) hours as needed (heartburn/indigestion).    Historical Provider, MD  guaifenesin (ROBITUSSIN) 100 MG/5ML syrup Take 200 mg by mouth every 6 (six) hours as needed for cough.     Historical Provider, MD  loperamide (IMODIUM) 2 MG capsule Take 2 mg by mouth as needed for diarrhea or loose stools.    Historical Provider, MD  magnesium hydroxide (MILK OF MAGNESIA) 400 MG/5ML suspension Take 30 mLs by mouth daily as needed for mild constipation or moderate constipation.    Historical Provider, MD  ondansetron (ZOFRAN) 4 MG tablet Take 4 mg by mouth every 6 (six) hours as needed for nausea or vomiting.    Historical Provider, MD  Vitamin D, Ergocalciferol, (DRISDOL) 50000 UNITS CAPS capsule Take 50,000 Units by mouth every 7 (seven) days.    Historical Provider, MD   BP 137/59 mmHg  Pulse 72  Temp(Src) 97.3 F (36.3 C) (Oral)  Resp 20  SpO2 96% Physical Exam  Constitutional: He appears well-developed and well-nourished. No distress.  HENT:  Head: Normocephalic and atraumatic.    Eyes: Pupils are equal, round, and reactive to light.  Neck: Normal range of motion. Neck supple. No spinous process tenderness and no muscular tenderness present.  Cardiovascular: Normal rate and regular rhythm.   Pulmonary/Chest: Effort normal.  Abdominal: Soft.  Neurological: He is alert.  Skin: Skin is warm and dry.  Psychiatric: His mood appears not anxious. His affect is not angry. His speech is not rapid and/or pressured. He is not agitated, not withdrawn and not actively hallucinating. He does not exhibit a depressed mood. He expresses no homicidal and no suicidal ideation.  Nursing note and vitals reviewed.   ED Course  Procedures (including critical care time) Labs Review Labs  Reviewed - No data to display  Imaging Review Ct Head Wo Contrast  10/30/2014   CLINICAL DATA:  Fall, hit head.  EXAM: CT HEAD WITHOUT CONTRAST  CT CERVICAL SPINE WITHOUT CONTRAST  TECHNIQUE: Multidetector CT imaging of the head and cervical spine was performed following the standard protocol without intravenous contrast. Multiplanar CT image reconstructions of the cervical spine were also generated.  COMPARISON:  None.  FINDINGS: CT HEAD FINDINGS  Old bilateral basal ganglia lacunar infarcts. No acute intracranial abnormality. Specifically, no hemorrhage, hydrocephalus, mass lesion, acute infarction, or significant intracranial injury. No acute calvarial abnormality. Visualized paranasal sinuses and mastoids clear. Orbital soft tissues unremarkable.  CT CERVICAL SPINE FINDINGS  Diffuse degenerative disc and facet disease throughout the cervical spine. Slight anterolisthesis of C3 on C4 related to facet disease.  Prevertebral soft tissues are normal. Calcifications within the posterior soft tissues overlying the spinous processes, likely related to old injury. No acute fracture. No epidural or paraspinal hematoma.  IMPRESSION: No acute intracranial abnormality.  Advanced spondylosis throughout the cervical spine. No acute bony abnormality.   Electronically Signed   By: Charlett Nose M.D.   On: 10/30/2014 11:22   Ct Cervical Spine Wo Contrast  10/30/2014   CLINICAL DATA:  Fall, hit head.  EXAM: CT HEAD WITHOUT CONTRAST  CT CERVICAL SPINE WITHOUT CONTRAST  TECHNIQUE: Multidetector CT imaging of the head and cervical spine was performed following the standard protocol without intravenous contrast. Multiplanar CT image reconstructions of the cervical spine were also generated.  COMPARISON:  None.  FINDINGS: CT HEAD FINDINGS  Old bilateral basal ganglia lacunar infarcts. No acute intracranial abnormality. Specifically, no hemorrhage, hydrocephalus, mass lesion, acute infarction, or significant intracranial injury.  No acute calvarial abnormality. Visualized paranasal sinuses and mastoids clear. Orbital soft tissues unremarkable.  CT CERVICAL SPINE FINDINGS  Diffuse degenerative disc and facet disease throughout the cervical spine. Slight anterolisthesis of C3 on C4 related to facet disease. Prevertebral soft tissues are normal. Calcifications within the posterior soft tissues overlying the spinous processes, likely related to old injury. No acute fracture. No epidural or paraspinal hematoma.  IMPRESSION: No acute intracranial abnormality.  Advanced spondylosis throughout the cervical spine. No acute bony abnormality.   Electronically Signed   By: Charlett Nose M.D.   On: 10/30/2014 11:22     EKG Interpretation None      MDM   Final diagnoses:  Agitation    Dr. Blinda Leatherwood has seen the patient as well and agrees with my assessment that the patient was very upset and acting out due to anger of not getting to eat lunch or dinner today. The patient is cooperative and coherent for [place]. He reports feeling much better after having two sandwiches and denies Si/Hi or any symptoms.  The plan is for the patient to be sent back to the facility. -- Dr. Blinda Leatherwood has rescinded the IVC paperwork.  74 y.o.Terrence Coss's evaluation in the Emergency Department is complete. It has been determined that no acute conditions requiring further emergency intervention are present at this time. The patient/guardian have been advised of the diagnosis and plan. We have discussed signs and symptoms that warrant return to the ED, such as changes or worsening in symptoms.  Vital signs are stable at discharge. Filed Vitals:   10/30/14 2040  BP: 137/59  Pulse: 72  Temp: 97.3 F (36.3 C)  Resp: 20    Patient/guardian has voiced understanding and agreed to follow-up with the PCP or specialist.     Marlon Pel, PA-C 10/30/14 2238  Gilda Crease, MD 10/30/14 2245

## 2014-11-29 ENCOUNTER — Emergency Department (HOSPITAL_COMMUNITY)
Admission: EM | Admit: 2014-11-29 | Discharge: 2014-12-01 | Disposition: A | Discharge status: Home / Self Care | Payer: Medicare Other | Attending: Emergency Medicine | Admitting: Emergency Medicine

## 2014-11-29 ENCOUNTER — Encounter (HOSPITAL_COMMUNITY): Payer: Self-pay | Admitting: Emergency Medicine

## 2014-11-29 DIAGNOSIS — G40909 Epilepsy, unspecified, not intractable, without status epilepticus: Secondary | ICD-10-CM | POA: Insufficient documentation

## 2014-11-29 DIAGNOSIS — F319 Bipolar disorder, unspecified: Secondary | ICD-10-CM | POA: Diagnosis not present

## 2014-11-29 DIAGNOSIS — Z79899 Other long term (current) drug therapy: Secondary | ICD-10-CM | POA: Insufficient documentation

## 2014-11-29 DIAGNOSIS — I4891 Unspecified atrial fibrillation: Secondary | ICD-10-CM | POA: Insufficient documentation

## 2014-11-29 DIAGNOSIS — Z01818 Encounter for other preprocedural examination: Secondary | ICD-10-CM

## 2014-11-29 DIAGNOSIS — Z046 Encounter for general psychiatric examination, requested by authority: Secondary | ICD-10-CM

## 2014-11-29 DIAGNOSIS — G309 Alzheimer's disease, unspecified: Secondary | ICD-10-CM | POA: Diagnosis not present

## 2014-11-29 DIAGNOSIS — Z8673 Personal history of transient ischemic attack (TIA), and cerebral infarction without residual deficits: Secondary | ICD-10-CM | POA: Diagnosis not present

## 2014-11-29 DIAGNOSIS — I10 Essential (primary) hypertension: Secondary | ICD-10-CM | POA: Insufficient documentation

## 2014-11-29 DIAGNOSIS — F0391 Unspecified dementia with behavioral disturbance: Secondary | ICD-10-CM | POA: Diagnosis not present

## 2014-11-29 DIAGNOSIS — Z87891 Personal history of nicotine dependence: Secondary | ICD-10-CM | POA: Insufficient documentation

## 2014-11-29 DIAGNOSIS — I509 Heart failure, unspecified: Secondary | ICD-10-CM | POA: Diagnosis not present

## 2014-11-29 DIAGNOSIS — F03918 Unspecified dementia, unspecified severity, with other behavioral disturbance: Secondary | ICD-10-CM

## 2014-11-29 DIAGNOSIS — F0281 Dementia in other diseases classified elsewhere with behavioral disturbance: Secondary | ICD-10-CM | POA: Diagnosis not present

## 2014-11-29 DIAGNOSIS — Z7982 Long term (current) use of aspirin: Secondary | ICD-10-CM | POA: Insufficient documentation

## 2014-11-29 DIAGNOSIS — Z7902 Long term (current) use of antithrombotics/antiplatelets: Secondary | ICD-10-CM | POA: Diagnosis not present

## 2014-11-29 DIAGNOSIS — F911 Conduct disorder, childhood-onset type: Secondary | ICD-10-CM | POA: Diagnosis present

## 2014-11-29 DIAGNOSIS — Z008 Encounter for other general examination: Secondary | ICD-10-CM | POA: Insufficient documentation

## 2014-11-29 HISTORY — DX: Alzheimer's disease, unspecified: G30.9

## 2014-11-29 HISTORY — DX: Dementia in other diseases classified elsewhere, unspecified severity, without behavioral disturbance, psychotic disturbance, mood disturbance, and anxiety: F02.80

## 2014-11-29 LAB — CBC WITH DIFFERENTIAL/PLATELET
Basophils Absolute: 0 10*3/uL (ref 0.0–0.1)
Basophils Relative: 0 % (ref 0–1)
EOS ABS: 0.1 10*3/uL (ref 0.0–0.7)
Eosinophils Relative: 2 % (ref 0–5)
HCT: 36.3 % — ABNORMAL LOW (ref 39.0–52.0)
Hemoglobin: 11.9 g/dL — ABNORMAL LOW (ref 13.0–17.0)
LYMPHS ABS: 1.3 10*3/uL (ref 0.7–4.0)
Lymphocytes Relative: 22 % (ref 12–46)
MCH: 29.1 pg (ref 26.0–34.0)
MCHC: 32.8 g/dL (ref 30.0–36.0)
MCV: 88.8 fL (ref 78.0–100.0)
MONOS PCT: 14 % — AB (ref 3–12)
Monocytes Absolute: 0.8 10*3/uL (ref 0.1–1.0)
NEUTROS ABS: 3.8 10*3/uL (ref 1.7–7.7)
NEUTROS PCT: 62 % (ref 43–77)
Platelets: 170 10*3/uL (ref 150–400)
RBC: 4.09 MIL/uL — ABNORMAL LOW (ref 4.22–5.81)
RDW: 13.9 % (ref 11.5–15.5)
WBC: 6.1 10*3/uL (ref 4.0–10.5)

## 2014-11-29 LAB — COMPREHENSIVE METABOLIC PANEL
ALK PHOS: 74 U/L (ref 38–126)
ALT: 16 U/L — ABNORMAL LOW (ref 17–63)
AST: 21 U/L (ref 15–41)
Albumin: 4.4 g/dL (ref 3.5–5.0)
Anion gap: 9 (ref 5–15)
BUN: 23 mg/dL — AB (ref 6–20)
CO2: 25 mmol/L (ref 22–32)
Calcium: 9.1 mg/dL (ref 8.9–10.3)
Chloride: 99 mmol/L — ABNORMAL LOW (ref 101–111)
Creatinine, Ser: 0.62 mg/dL (ref 0.61–1.24)
Glucose, Bld: 104 mg/dL — ABNORMAL HIGH (ref 65–99)
Potassium: 3.9 mmol/L (ref 3.5–5.1)
Sodium: 133 mmol/L — ABNORMAL LOW (ref 135–145)
TOTAL PROTEIN: 6.8 g/dL (ref 6.5–8.1)
Total Bilirubin: 0.7 mg/dL (ref 0.3–1.2)

## 2014-11-29 LAB — PHENYTOIN LEVEL, TOTAL

## 2014-11-29 LAB — URINALYSIS, ROUTINE W REFLEX MICROSCOPIC
Bilirubin Urine: NEGATIVE
Glucose, UA: NEGATIVE mg/dL
HGB URINE DIPSTICK: NEGATIVE
Ketones, ur: NEGATIVE mg/dL
Leukocytes, UA: NEGATIVE
NITRITE: NEGATIVE
Protein, ur: NEGATIVE mg/dL
SPECIFIC GRAVITY, URINE: 1.017 (ref 1.005–1.030)
UROBILINOGEN UA: 0.2 mg/dL (ref 0.0–1.0)
pH: 6 (ref 5.0–8.0)

## 2014-11-29 MED ORDER — LOPERAMIDE HCL 2 MG PO CAPS: 2.0000 mg | ORAL_CAPSULE | ORAL | Status: DC | PRN

## 2014-11-29 MED ORDER — LORAZEPAM 0.5 MG PO TABS
0.5000 mg | ORAL_TABLET | Freq: Three times a day (TID) | ORAL | Status: DC | PRN
  Administered 2014-11-29 – 2014-11-30 (×3): 0.5 mg via ORAL
  Filled 2014-11-29 (×4): qty 1

## 2014-11-29 MED ORDER — ALBUTEROL SULFATE HFA 108 (90 BASE) MCG/ACT IN AERS: 2.0000 | INHALATION_SPRAY | Freq: Four times a day (QID) | RESPIRATORY_TRACT | Status: DC | PRN

## 2014-11-29 MED ORDER — ONDANSETRON HCL 4 MG PO TABS: 4.0000 mg | ORAL_TABLET | Freq: Four times a day (QID) | ORAL | Status: DC | PRN

## 2014-11-29 MED ORDER — ACETAMINOPHEN 500 MG PO TABS: 500.0000 mg | ORAL_TABLET | ORAL | Status: DC | PRN

## 2014-11-29 MED ORDER — ATORVASTATIN CALCIUM 80 MG PO TABS
80.0000 mg | ORAL_TABLET | Freq: Every day | ORAL | Status: DC
  Administered 2014-11-29 – 2014-11-30 (×2): 80 mg via ORAL
  Filled 2014-11-29 (×3): qty 1

## 2014-11-29 MED ORDER — ALUM & MAG HYDROXIDE-SIMETH 200-200-20 MG/5ML PO SUSP: 15.0000 mL | Freq: Four times a day (QID) | ORAL | Status: DC | PRN

## 2014-11-29 MED ORDER — DILTIAZEM HCL 30 MG PO TABS
30.0000 mg | ORAL_TABLET | Freq: Two times a day (BID) | ORAL | Status: DC
  Administered 2014-11-29 – 2014-12-01 (×4): 30 mg via ORAL
  Filled 2014-11-29 (×5): qty 1

## 2014-11-29 MED ORDER — FAMOTIDINE 20 MG PO TABS
20.0000 mg | ORAL_TABLET | Freq: Two times a day (BID) | ORAL | Status: DC
  Administered 2014-11-29 – 2014-12-01 (×4): 20 mg via ORAL
  Filled 2014-11-29 (×4): qty 1

## 2014-11-29 MED ORDER — LEVETIRACETAM 500 MG PO TABS
1000.0000 mg | ORAL_TABLET | Freq: Two times a day (BID) | ORAL | Status: DC
  Administered 2014-11-29 – 2014-12-01 (×4): 1000 mg via ORAL
  Filled 2014-11-29 (×6): qty 2

## 2014-11-29 MED ORDER — METOPROLOL TARTRATE 25 MG PO TABS
50.0000 mg | ORAL_TABLET | Freq: Every day | ORAL | Status: DC
  Administered 2014-11-29 – 2014-12-01 (×2): 50 mg via ORAL
  Filled 2014-11-29 (×3): qty 2

## 2014-11-29 MED ORDER — RISPERIDONE 1 MG/ML PO SOLN
1.0000 mg | Freq: Two times a day (BID) | ORAL | Status: DC
  Administered 2014-11-29 – 2014-12-01 (×4): 1 mg via ORAL
  Filled 2014-11-29 (×6): qty 1

## 2014-11-29 MED ORDER — PHENYTOIN SODIUM EXTENDED 100 MG PO CAPS
200.0000 mg | ORAL_CAPSULE | Freq: Three times a day (TID) | ORAL | Status: DC
  Administered 2014-11-29 – 2014-12-01 (×5): 200 mg via ORAL
  Filled 2014-11-29 (×5): qty 2

## 2014-11-29 MED ORDER — FUROSEMIDE 40 MG PO TABS
20.0000 mg | ORAL_TABLET | Freq: Every day | ORAL | Status: DC
  Administered 2014-11-29 – 2014-12-01 (×3): 20 mg via ORAL
  Filled 2014-11-29 (×3): qty 1

## 2014-11-29 MED ORDER — MAGNESIUM HYDROXIDE 400 MG/5ML PO SUSP: 30.0000 mL | Freq: Every day | ORAL | Status: DC | PRN

## 2014-11-29 MED ORDER — ENALAPRIL MALEATE 10 MG PO TABS
10.0000 mg | ORAL_TABLET | Freq: Every day | ORAL | Status: DC
  Administered 2014-12-01: 10 mg via ORAL
  Filled 2014-11-29 (×3): qty 1

## 2014-11-29 MED ORDER — ASPIRIN EC 81 MG PO TBEC
81.0000 mg | DELAYED_RELEASE_TABLET | Freq: Every day | ORAL | Status: DC
  Administered 2014-11-29 – 2014-12-01 (×3): 81 mg via ORAL
  Filled 2014-11-29 (×3): qty 1

## 2014-11-29 MED ORDER — CLOPIDOGREL BISULFATE 75 MG PO TABS
75.0000 mg | ORAL_TABLET | Freq: Every day | ORAL | Status: DC
  Administered 2014-11-29 – 2014-12-01 (×3): 75 mg via ORAL
  Filled 2014-11-29 (×3): qty 1

## 2014-11-29 MED ORDER — MELATONIN 3 MG PO TABS: 1.0000 | ORAL_TABLET | Freq: Every day | ORAL | Status: DC

## 2014-11-29 NOTE — ED Notes (Signed)
Per EMS. [facility]. Hx of dementia. Pt has paperwork saying that he has been discharged from the nursing home to the emergency department. Pt has hx of being IVC'd. Pt has been refusing medications at facility, and facility states he has been verbally aggressive towards staff and other residents. No physical aggression. Pt oriented per norm. Nursing home staff told EMS pt was [facility] a [facility] or tomorrow am. Consulting civil engineerCharge RN and social work informed and are speaking with facility.

## 2014-11-29 NOTE — ED Notes (Addendum)
Patient is Eating. This RN will attempt to administer medications

## 2014-11-29 NOTE — Progress Notes (Signed)
Nurse notified CSW that [facility].   CSW reached out to [facility] and spoke with Delorise ShinerGrace. She states that they have previously spoke with [facility]. She states that they did not offer the pt a bed and informed the that a bed may not be available until Monday.  CSW reached out to Jennelle Humanoug Barrik who works for [employer] at [phone] to inquire about the qualifications for an immediate discharge.   Per note, the facility states that the pt has been verbally aggressive towards staff and other residents. No physical aggression.  CSW spoke with physician who states that she will put in a psych evaluation for pt.   Trish MageBrittney Wilba Mutz, LCSWA [phone] ED CSW 11/29/2014 6:50 PM

## 2014-11-29 NOTE — Progress Notes (Signed)

## 2014-11-29 NOTE — BH Assessment (Addendum)
Tele Assessment Note   Alex MooreWilliam Garner is an 74 y.o. male. Writer attempted to assess the Pt. Writer was unable to assess the Pt due to the Pt's mental capacity. Pt has been diagnosed with dementia. Pt was mumbling to himself. Pt would not respond to the writer.   Collateral Information from RN's notes:  Per EMS. [facility]. Hx of dementia. Pt has paperwork saying that he has been discharged from the nursing home to the emergency department. Pt has hx of being IVC'd. Pt has been refusing medications at facility, and facility states he has been verbally aggressive towards staff and other residents. No physical aggression. Pt oriented per norm. Nursing home staff told EMS [facility] or tomorrow am. Consulting civil engineerCharge RN and social work informed and are speaking with facility.       Writer consulted with Dr. Dub MikesLugo. Per Dr. Dub MikesLugo Pt will remain the ED for observation. Pt pending am psych evaluation.    Axis I: Dementia Axis II: Deferred Axis III:  Past Medical History  Diagnosis Date  . Seizures   . Stroke   . CHF (congestive heart failure)   . Hypertension   . Atrial fibrillation   . GERD (gastroesophageal reflux disease)   . Bipolar 1 disorder   . Mental health problem     hoarder  . Alzheimer's dementia    Axis IV: economic problems, educational problems, problems with access to health care services and problems with primary support group Axis V: 31-40 impairment in reality testing  Past Medical History:  Past Medical History  Diagnosis Date  . Seizures   . Stroke   . CHF (congestive heart failure)   . Hypertension   . Atrial fibrillation   . GERD (gastroesophageal reflux disease)   . Bipolar 1 disorder   . Mental health problem     hoarder  . Alzheimer's dementia     History reviewed. No pertinent past surgical history.  Family History:  Family History  Problem Relation Age of Onset  . Family history unknown: Yes     Social History:  reports that he has quit smoking. He has never used smokeless tobacco. He reports that he does not drink alcohol or use illicit drugs.  Additional Social History:  Alcohol / Drug Use Pain Medications: UTA Prescriptions: UTA Over the Counter: UTA Longest period of sobriety (when/how long): NA  CIWA: CIWA-Ar BP: 133/87 mmHg Pulse Rate: 87 COWS:    PATIENT STRENGTHS: (choose at least two) Communication skills Physical Health  Allergies: No Known Allergies  Home Medications:  (Not in a hospital admission)  OB/GYN Status:  No LMP for male patient.  General Assessment Data Location of Assessment: [facility] ED TTS Assessment: In system Is this a Tele or Face-to-Face Assessment?: Tele Assessment Is this an Initial Assessment or a Re-assessment for this encounter?: Initial Assessment Marital status: Other (comment) Rich Reining(UTA) Maiden name: NA Is patient pregnant?: No Pregnancy Status: No Living Arrangements: Other (Comment) (Nursing home) Can pt return to current living arrangement?: No Admission Status: Voluntary Is patient capable of signing voluntary admission?: No Referral Source: Other (nursing home) Insurance type: Medicare     Crisis Care Plan Living Arrangements: Other (Comment) (Nursing home) Name of Psychiatrist: NA Name of Therapist: NA  Education Status Is patient currently in school?: No Current Grade: NA Highest grade of school patient has completed: NA Name of school: NA Contact person: NA  Risk to self with the  past 6 months Suicidal Ideation: No (UTA) Has patient been a risk to self within the past 6 months prior to admission? : No (UTA) Suicidal Intent: No (UTA) Has patient had any suicidal intent within the past 6 months prior to admission? : Other (comment) (UTA) Is patient at risk for suicide?: No (UTA) Suicidal Plan?: No (UTA) Has patient had any suicidal plan within the past 6 months prior to admission? : No (UTA) Access to Means:  No What has been your use of drugs/alcohol within the last 12 months?: UTA Previous Attempts/Gestures: No How many times?: 0 Other Self Harm Risks: NA Triggers for Past Attempts: None known Intentional Self Injurious Behavior: None Family Suicide History: Unable to assess Recent stressful life event(s): Other (Comment) (UTA) Persecutory voices/beliefs?: No (UTA) Depression: No Depression Symptoms:  (UTA) Substance abuse history and/or treatment for substance abuse?: No Suicide prevention information given to non-admitted patients: Not applicable  Risk to Others within the past 6 months Homicidal Ideation: No (UTA) Does patient have any lifetime risk of violence toward others beyond the six months prior to admission? : Unknown Thoughts of Harm to Others: No (UTA) Current Homicidal Intent: No (UTA) Current Homicidal Plan: No Access to Homicidal Means: No Identified Victim: NA History of harm to others?: No (UTA) Assessment of Violence: None Noted Violent Behavior Description: NA Does patient have access to weapons?: No Criminal Charges Pending?: No Does patient have a court date: No Is patient on probation?: Unknown  Psychosis Hallucinations: None noted Delusions: None noted  Mental Status Report Appearance/Hygiene: Unable to Assess Eye Contact: Unable to Assess Motor Activity: Freedom of movement, Gestures Speech: Incoherent, Tangential Level of Consciousness: Alert Mood: Other (Comment) (UTA) Affect: Other (Comment) (UTA) Anxiety Level: None Thought Processes: Tangential Judgement: Unable to Assess Orientation: Unable to assess Obsessive Compulsive Thoughts/Behaviors: Unable to Assess  Cognitive Functioning Concentration: Unable to Assess Memory: Unable to Assess IQ:  (UTA) Insight: Unable to Assess Impulse Control: Unable to Assess Appetite: Fair (UTA) Weight Loss: 0 Weight Gain: 0 Sleep: Unable to Assess Total Hours of Sleep: 5 Vegetative Symptoms: Unable  to Assess  ADLScreening [facility] Assessment Services) Patient's cognitive ability adequate to safely complete daily activities?: No Patient able to express need for assistance with ADLs?: No Independently performs ADLs?: No  Prior Inpatient Therapy Prior Inpatient Therapy: No Prior Therapy Dates: NA Prior Therapy Facilty/Provider(s): NA Reason for Treatment: NA  Prior Outpatient Therapy Prior Outpatient Therapy: No Prior Therapy Dates: Na Prior Therapy Facilty/Provider(s): NA Reason for Treatment: NA Does patient have an ACCT team?: No Does patient have Intensive In-House Services?  : No Does patient have [facility] services? : No Does patient have P4CC services?: No  ADL Screening (condition at time of admission) Patient's cognitive ability adequate to safely complete daily activities?: No Is the patient deaf or have difficulty hearing?: Yes Does the patient have difficulty seeing, even when wearing glasses/contacts?: Yes Does the patient have difficulty concentrating, remembering, or making decisions?: No Patient able to express need for assistance with ADLs?: No Does the patient have difficulty dressing or bathing?: Yes Independently performs ADLs?: No Communication: Dependent Dressing (OT): Dependent Grooming: Dependent Feeding: Dependent Bathing: Dependent Toileting: Dependent In/Out Bed: Dependent Walks in Home: Dependent       Abuse/Neglect Assessment (Assessment to be complete while patient is alone) Physical Abuse: Denies (UTA) Verbal Abuse: Denies (UTA) Sexual Abuse: Denies (UTA) Exploitation of patient/patient's resources: Denies Industrial/product designer) Self-Neglect: Denies Industrial/product designer)     Merchant navy officer (For Healthcare) Does patient have  an advance directive?: No Would patient like information on creating an advanced directive?: No - patient declined information    Additional Information 1:1 In Past 12 Months?: No CIRT Risk: No Elopement Risk: No Does patient have medical  clearance?: No     Disposition:  Disposition Initial Assessment Completed for this Encounter: Yes Disposition of Patient: Other dispositions (Pending am psych evaluation) Other disposition(s): Other (Comment) (Pending am psych evaluation)  Mimi Debellis D 11/29/2014 6:51 PM

## 2014-11-29 NOTE — ED Provider Notes (Signed)
TIME SEEN: 5:30 PM  CHIEF COMPLAINT: IVC  HPI: Pt is a 74 y.o. male with history of dementia, bipolar disorder, atrial fibrillation not on anticoagulation, CHF, hypertension, seizures on Dilantin, prior stroke who presents to the emergency department after he was involuntarily committed by his nursing home. They report patient has been aggressive towards other residents and staff. Nursing home at this time is refusing to take patient back stating that they have "discharged him". Unable to obtain any history from patient given his dementia.   Patient has had a negative head CT on 10/30/2014. Has also had an MRI of his brain every 26 2016 which shows remote lacunar infarcts and atrophy with no other acute change.  ROS: Level V caveat for dementia  PAST MEDICAL HISTORY/PAST SURGICAL HISTORY:  Past Medical History  Diagnosis Date  . Seizures   . Stroke   . CHF (congestive heart failure)   . Hypertension   . Atrial fibrillation   . GERD (gastroesophageal reflux disease)   . Bipolar 1 disorder   . Mental health problem     hoarder  . Alzheimer's dementia     MEDICATIONS:  Prior to Admission medications   Medication Sig Start Date End Date Taking? Authorizing Provider  acetaminophen (TYLENOL) 500 MG tablet Take 500 mg by mouth every 4 (four) hours as needed for mild pain, fever or headache.    Yes Historical Provider, MD  albuterol (PROVENTIL HFA;VENTOLIN HFA) 108 (90 BASE) MCG/ACT inhaler Inhale 2 puffs into the lungs every 6 (six) hours as needed for wheezing or shortness of breath.   Yes Historical Provider, MD  Alum & Mag Hydroxide-Simeth (GERI-LANTA PO) Take 30 mLs by mouth every 6 (six) hours as needed (heartburn/indigestion).   Yes Historical Provider, MD  aspirin EC 81 MG tablet Take 81 mg by mouth daily.   Yes Historical Provider, MD  atorvastatin (LIPITOR) 80 MG tablet Take 80 mg by mouth at bedtime.   Yes Historical Provider, MD  clopidogrel (PLAVIX) 75 MG tablet Take 75 mg by  mouth daily.   Yes Historical Provider, MD  diltiazem (CARDIZEM) 30 MG tablet Take 30 mg by mouth 2 (two) times daily.   Yes Historical Provider, MD  enalapril (VASOTEC) 10 MG tablet Take 10 mg by mouth daily.   Yes Historical Provider, MD  furosemide (LASIX) 20 MG tablet Take 20 mg by mouth daily.    Yes Historical Provider, MD  guaifenesin (ROBITUSSIN) 100 MG/5ML syrup Take 200 mg by mouth every 6 (six) hours as needed for cough.    Yes Historical Provider, MD  levETIRAcetam (KEPPRA) 500 MG tablet Take 1,000 mg by mouth 2 (two) times daily.   Yes Historical Provider, MD  loperamide (IMODIUM) 2 MG capsule Take 2 mg by mouth as needed for diarrhea or loose stools.   Yes Historical Provider, MD  LORazepam (ATIVAN) 0.5 MG tablet Take 1 tablet (0.5 mg total) by mouth 3 (three) times daily as needed (agitation). 10/08/14  Yes Rolland PorterMark James, MD  magnesium hydroxide (MILK OF MAGNESIA) 400 MG/5ML suspension Take 30 mLs by mouth daily as needed for mild constipation or moderate constipation.   Yes Historical Provider, MD  Melatonin 3 MG TABS Take 1 tablet by mouth at bedtime.   Yes Historical Provider, MD  metoprolol (LOPRESSOR) 50 MG tablet Take 50 mg by mouth daily.   Yes Historical Provider, MD  ondansetron (ZOFRAN) 4 MG tablet Take 4 mg by mouth every 6 (six) hours as needed for nausea or vomiting.  Yes Historical Provider, MD  phenytoin (DILANTIN) 100 MG ER capsule Take 200 mg by mouth 3 (three) times daily.   Yes Historical Provider, MD  PRESCRIPTION MEDICATION Apply 1 mL topically every 6 (six) hours as needed (for agitation). Lorazepam /ml prefill syr   Yes Historical Provider, MD  ranitidine (ZANTAC) 150 MG tablet Take 150 mg by mouth 2 (two) times daily.   Yes Historical Provider, MD  risperiDONE (RISPERDAL) 1 MG/ML oral solution Take 1 mg by mouth 2 (two) times daily.   Yes Historical Provider, MD  Vitamin D, Ergocalciferol, (DRISDOL) 50000 UNITS CAPS capsule Take 50,000 Units by mouth every 7  (seven) days.   Yes Historical Provider, MD    ALLERGIES:  No Known Allergies  SOCIAL HISTORY:  History  Substance Use Topics  . Smoking status: Former Games developer  . Smokeless tobacco: Never Used  . Alcohol Use: No    FAMILY HISTORY: Family History  Problem Relation Age of Onset  . Family history unknown: Yes    EXAM: BP 133/87 mmHg  Pulse 87  Temp(Src) 98.2 F (36.8 C) (Oral)  Resp 20  SpO2 97% CONSTITUTIONAL: Alert but unable to answer questions or follow commands, is talking but speech is nonsensical and pressured HEAD: Normocephalic EYES: Conjunctivae clear, PERRL ENT: normal nose; no rhinorrhea; moist mucous membranes; pharynx without lesions noted NECK: Supple, no meningismus, no LAD  CARD: RRR; S1 and S2 appreciated; no murmurs, no clicks, no rubs, no gallops RESP: Normal chest excursion without splinting or tachypnea; breath sounds clear and equal bilaterally; no wheezes, no rhonchi, no rales, no hypoxia or respiratory distress, speaking full sentences ABD/GI: Normal bowel sounds; non-distended; soft, non-tender, no rebound, no guarding, no peritoneal signs BACK:  The back appears normal and is non-tender to palpation, there is no CVA tenderness EXT: Normal ROM in all joints; non-tender to palpation; no edema; normal capillary refill; no cyanosis, no calf tenderness or swelling    SKIN: Normal color for age and race; warm NEURO: Moves all extremities equally, no facial droop, no slurred speech, nonsensical speech PSYCH: Nonsensical speech, unable to assess SI, HI or hallucinations  MEDICAL DECISION MAKING: Patient here after he was involuntarily committed by his nursing home for aggressive behavior. Nursing home is refusing to take the patient back. Brittney with social work has been attempting to get the patient back to his nursing facility. At this time we are unable to do so. She recommends obtaining a TTS consult as patient may need Geri psychiatric placement.  Screening labs and urine pending.  ED PROGRESS: Labs and urine are unremarkable. I do not feel he needs repeat head imaging given his nonfocal neurologic exam he appears to be at his baseline. He has been IVC'ed for similar episodes in the past and has had negative head CT imaging at that time. I feel he is medically cleared.  TTS has evaluated patient and plan to reassess him in the morning.     Layla Maw Aydden Cumpian, DO 11/29/14 2330

## 2014-11-29 NOTE — ED Notes (Signed)
Bed: WA17 Expected date:  Expected time:  Means of arrival:  Comments: EMS/41M/aggressive

## 2014-11-29 NOTE — BHH Counselor (Signed)
Writer is attempting to see Pt. Writer is unable to see Pt at this time because remote for the tele-assess machine cannot be located.  Alex PhoenixBrandi Tashiana Garner, [facility] Triage Specialist

## 2014-11-29 NOTE — ED Notes (Signed)
Safety Sitter at bedside 

## 2014-11-29 NOTE — ED Notes (Signed)
BP rechecked for accuracy by this RN cuff repositioned. BP 131/64

## 2014-11-29 NOTE — ED Notes (Signed)
Patient is rambling, incoherently for hours.  Medicated with PRN medication.

## 2014-11-30 ENCOUNTER — Emergency Department (HOSPITAL_COMMUNITY): Payer: Medicare Other

## 2014-11-30 DIAGNOSIS — F0391 Unspecified dementia with behavioral disturbance: Secondary | ICD-10-CM | POA: Diagnosis not present

## 2014-11-30 DIAGNOSIS — Z01818 Encounter for other preprocedural examination: Secondary | ICD-10-CM | POA: Insufficient documentation

## 2014-11-30 DIAGNOSIS — F03918 Unspecified dementia, unspecified severity, with other behavioral disturbance: Secondary | ICD-10-CM | POA: Insufficient documentation

## 2014-11-30 DIAGNOSIS — G309 Alzheimer's disease, unspecified: Secondary | ICD-10-CM | POA: Diagnosis not present

## 2014-11-30 MED ORDER — LORAZEPAM 0.5 MG PO TABS
0.5000 mg | ORAL_TABLET | Freq: Once | ORAL | Status: AC
  Administered 2014-11-30: 0.5 mg via ORAL

## 2014-11-30 NOTE — ED Notes (Signed)
Patient continues to talk rapidly, nonsensical speech.  Allows staff to repostion for comfort.

## 2014-11-30 NOTE — ED Notes (Addendum)
Pt alert to self, restless, yelling at the staff members, trying to get oob with out assist, safety sitter in reach of the pt, will continue to reorient and monitor. Estill DoomsSimon, Daran Favaro Mahario, RN 8:47 PM 11/30/2014

## 2014-11-30 NOTE — ED Notes (Signed)
Pt cooperative, sitter assisted with toileting and shower. Pt pleasant and able to follow directions.

## 2014-11-30 NOTE — Progress Notes (Signed)
CSW obtained EKG and chest xray clinicals. CS[facility] and [facility].  Trish MageBrittney Wylee Dorantes, LCSWA [phone] ED CSW 11/30/2014 7:15 PM

## 2014-11-30 NOTE — Progress Notes (Signed)
CSW reached out to Jennelle Humanoug Barrik who is the Financial plannerolicy Coordinator for Adult Care Licensure Section.  CSW inquired about the proper qualifications for an immediate discharge. He states that the facility must have reason to believe that the pt is a safety risk to himself or other residents.  CSW did check the pt's chart. The facility sent discharge notice paperwork. He states that if the family or pt's wishes they may appeal.  Also, he states that if it is an improper discharge a report can be called into the state at [phone].  Trish MageBrittney Rosalio Catterton, LCSWA [phone] ED CSW 11/30/2014 6:58 PM

## 2014-11-30 NOTE — ED Notes (Signed)
Pt. Was taking a shower when we came out the shower to dry off and that's when the Nurse came in to give the rest of his meds. Pt. then became very agitated about taking his meds. Pt. Stated what are you trying to do overdose me. Pt. Stated  "I'm not taking this fucking shit." "Get that shit out my face." I the writer then asked him to calm down and take his meds. He didn't comply at first but then he eventually agreed take his meds once given coffee. After patient was dried and dressed pt. Was then walked back to his bed by 2 assist and then he became very apologetic.

## 2014-11-30 NOTE — BH Assessment (Signed)
BHH Assessment Progress Note  The following facilities have been contacted to seek placement for this patient, with results as noted:  Beds available, information sent, decision pending:  [facility] [address] [facility]  At capacity, but accepting referrals for future consideration; information sent:  [place]  [facility]  Sergio [place], [place] Triage Specialist [phone]

## 2014-11-30 NOTE — Consult Note (Signed)
  Attempted to evaluate patient several times but could not due to his disorganized mental state.  Patient was sleeping at one time due to receiving his Ativan.  At another  Attempt he could not participate as he is disoriented and confused.  However, patient is in the process of being accepted  [facility] at [facility] Gero-psychiatric unit.  We will continue to monitor patient with plan to [facility].   Plan: Obtain preadmission EKG and Chest xray.  Administer Ativan 0.5 mg po every 8 hrs as needed for agitation Dahlia ByesJosephine Onuoha, PMHNP-BC Patient seen face-to-face for psychiatric evaluation, chart reviewed and case discussed with the physician extender and developed treatment plan. Reviewed the information documented and agree with the treatment plan. Thedore MinsMojeed Aryam Zhan, MD

## 2014-12-01 DIAGNOSIS — G309 Alzheimer's disease, unspecified: Secondary | ICD-10-CM | POA: Diagnosis not present

## 2014-12-01 NOTE — BHH Counselor (Addendum)
LCSW obtained IVC paperwork from TCU and [facility] for review at [phone], received fax confirmation of fax delivered.   LCSW spoke with Lauren in intake to confirm that the fax was received. Lauren states that the patient has been accepted and the nurses will need to contact [phone] for nurse report once the patient is being transported to their facility.    LCSW relayed this information to [facility].

## 2014-12-01 NOTE — ED Notes (Signed)
Report given to sheriff.

## 2015-11-06 IMAGING — CT CT HEAD W/O CM
3 of 6 series · 14 of 37 positions shown, 16 images · non-contrast
Comparison: None.

CLINICAL DATA: Fall, hit head.

EXAM:
CT HEAD WITHOUT CONTRAST
CT CERVICAL SPINE WITHOUT CONTRAST
TECHNIQUE: Multidetector CT imaging of the head and cervical spine was
performed following the standard protocol without intravenous
contrast. Multiplanar CT image reconstructions of the cervical spine
were also generated.

[Series 3: bone windows · axial · 0.47mm/px · z∈[+1570,+1645]mm · 3 of 51 slices shown]
[im 13/51  bone]
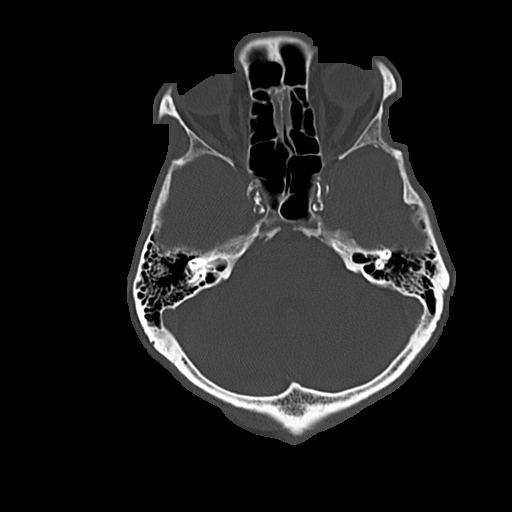
[im 26/51  bone]
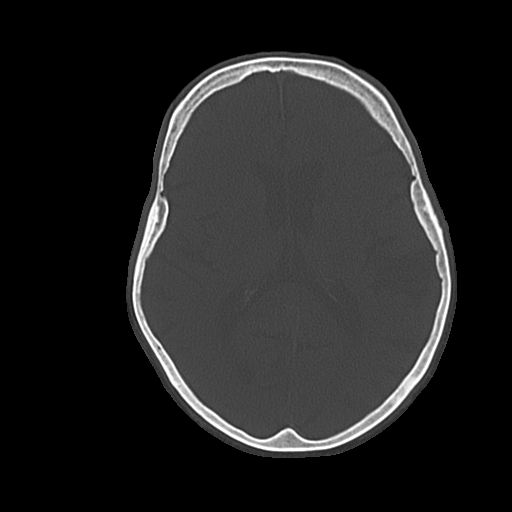
[im 38/51  bone]
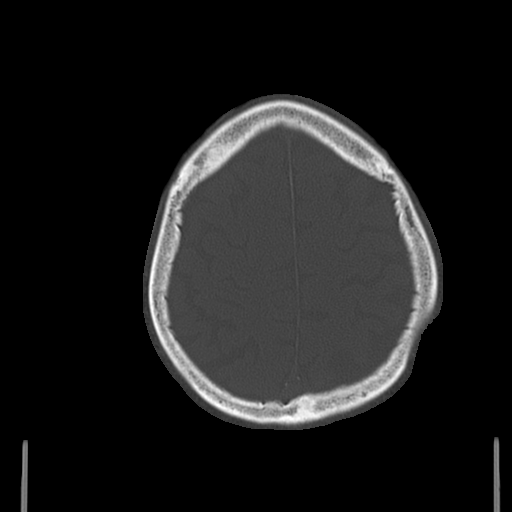

[Series 9: axial recon · axial · 0.23mm/px · z∈[+1378,+1516]mm · 8 of 105 slices shown, 10 images]
[im 12/105  brain]
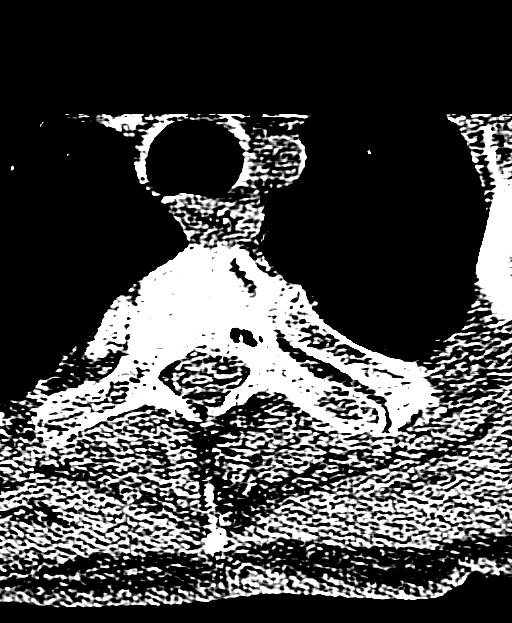
[im 12/105  bone]
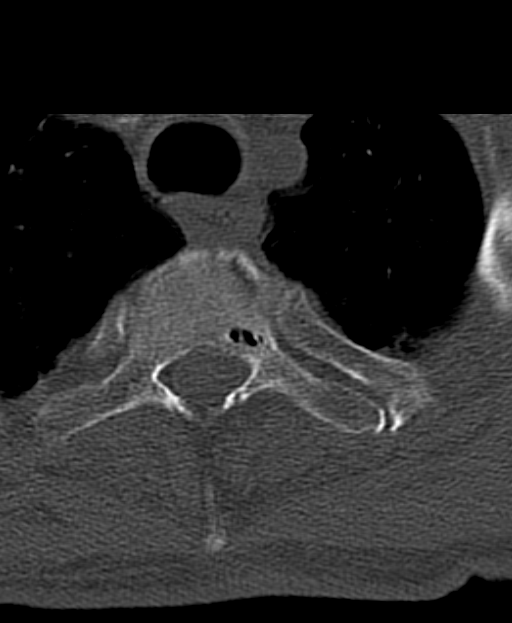
[im 24/105  brain]
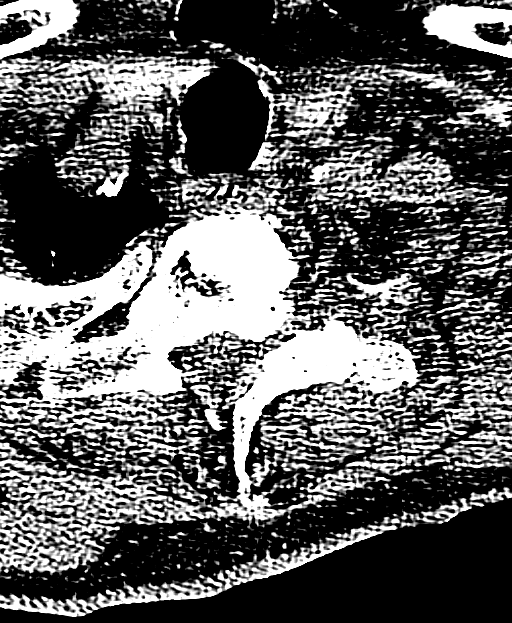
[im 35/105  brain]
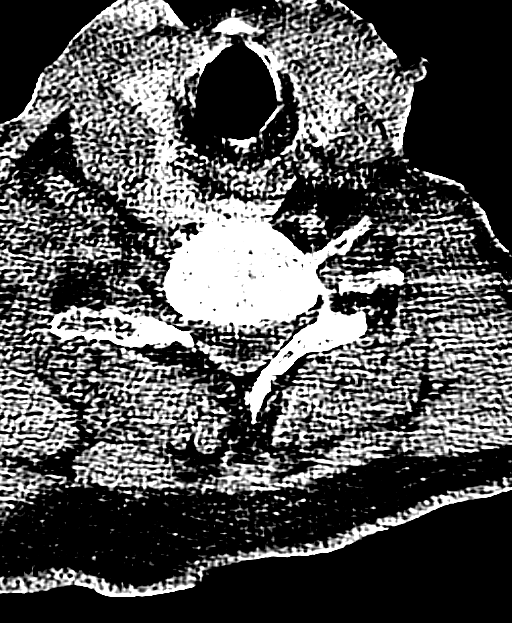
[im 47/105  brain]
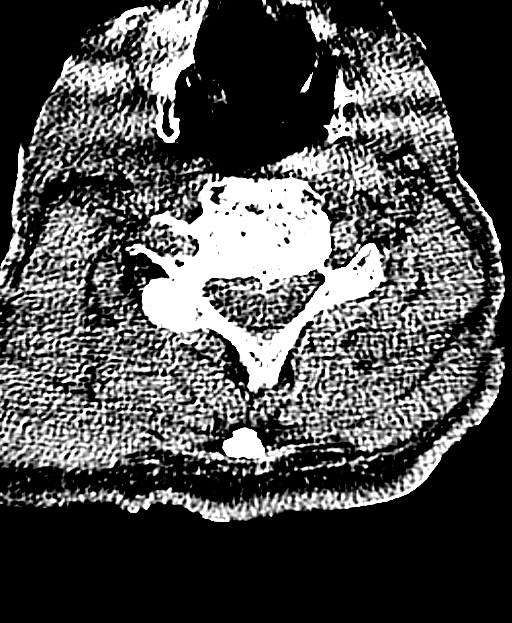
[im 58/105  brain]
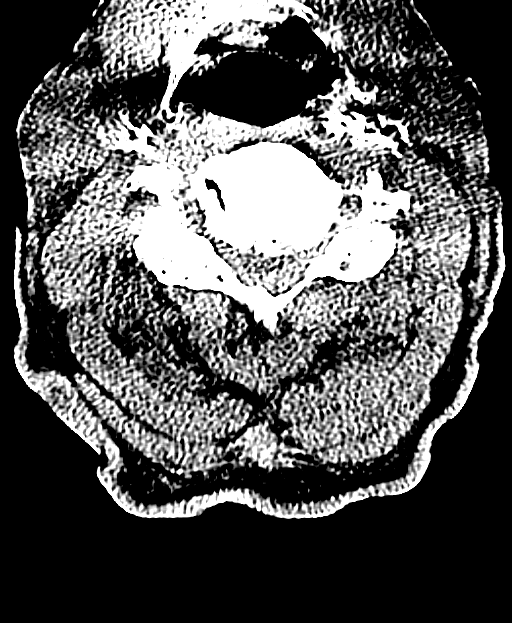
[im 58/105  bone]
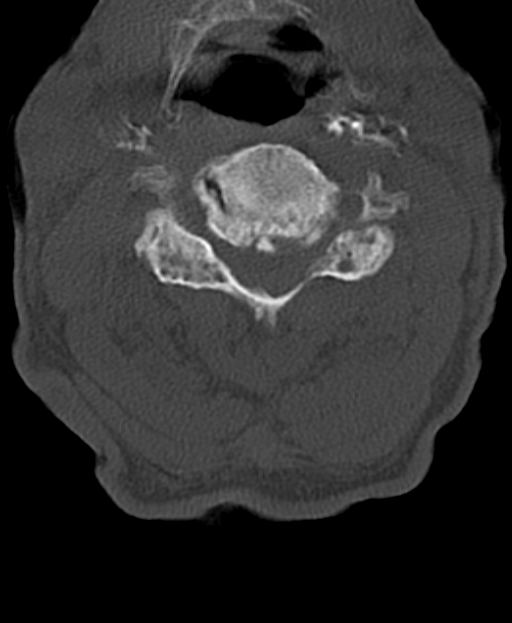
[im 70/105  brain]
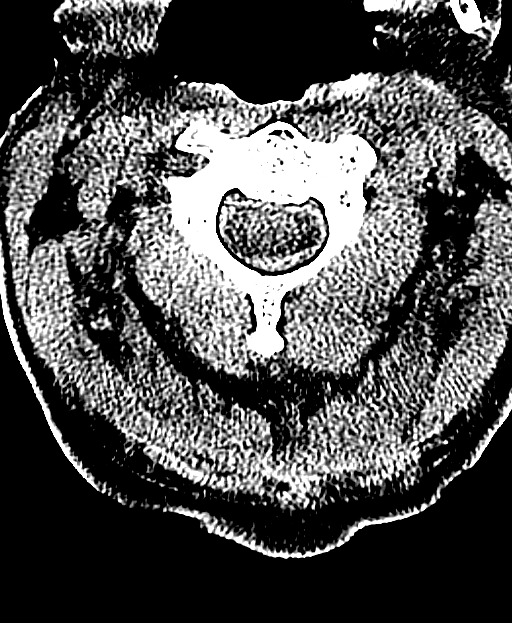
[im 81/105  brain]
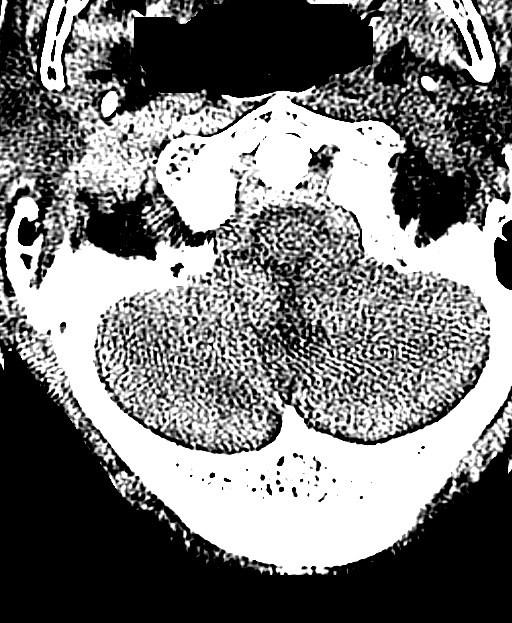
[im 93/105  brain]
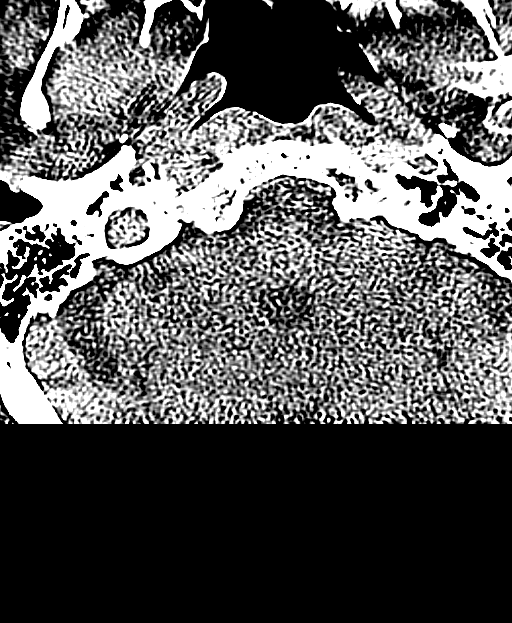

[Series 602: <mpr thick range> · sagittal · 0.42mm/px · 3 of 31 slices shown]
[im 11/31  brain]
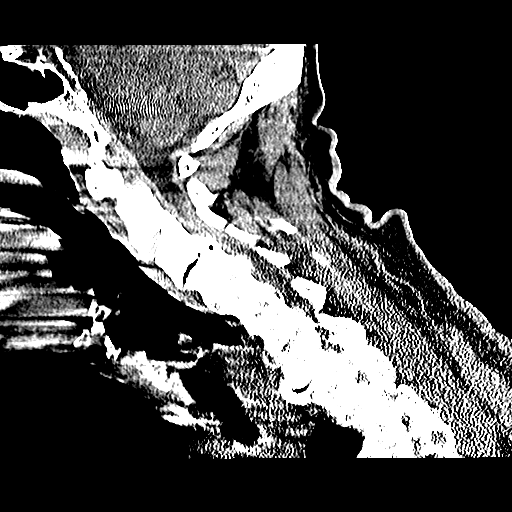
[im 16/31  brain]
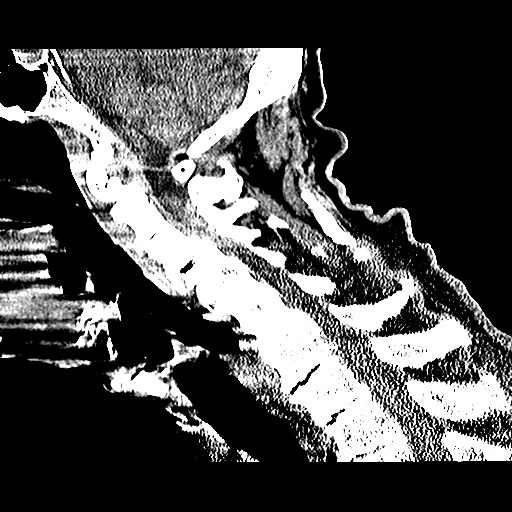
[im 21/31  brain]
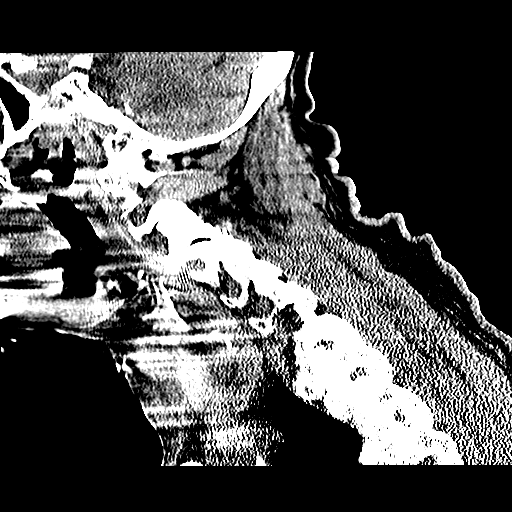

[14 of 37 positions shown; findings below may reference images not displayed]

FINDINGS: CT HEAD FINDINGS

Old bilateral basal ganglia lacunar infarcts. No acute intracranial
abnormality. Specifically, no hemorrhage, hydrocephalus, mass
lesion, acute infarction, or significant intracranial injury. No
acute calvarial abnormality. Visualized paranasal sinuses and
mastoids clear. Orbital soft tissues unremarkable.

CT CERVICAL SPINE FINDINGS

Diffuse degenerative disc and facet disease throughout the cervical
spine. Slight anterolisthesis of C3 on C4 related to facet disease.
Prevertebral soft tissues are normal. Calcifications within the
posterior soft tissues overlying the spinous processes, likely
related to old injury. No acute fracture. No epidural or paraspinal
hematoma.
IMPRESSION: No acute intracranial abnormality.

Advanced spondylosis throughout the cervical spine. No acute bony
abnormality.

## 2017-09-03 DEATH — deceased
